# Patient Record
Sex: Male | Born: 1965 | Race: White | Hispanic: No | Smoking: Former smoker
Health system: Southern US, Community
[De-identification: ages and names within clinical notes are randomized; demographics above are authoritative.]

## PROBLEM LIST (undated history)

## (undated) DIAGNOSIS — J449 Chronic obstructive pulmonary disease, unspecified: Secondary | ICD-10-CM

## (undated) DIAGNOSIS — E785 Hyperlipidemia, unspecified: Secondary | ICD-10-CM

## (undated) DIAGNOSIS — T7840XA Allergy, unspecified, initial encounter: Secondary | ICD-10-CM

## (undated) DIAGNOSIS — I10 Essential (primary) hypertension: Secondary | ICD-10-CM

## (undated) HISTORY — PX: SHOULDER SURGERY: SHX246

## (undated) HISTORY — PX: KIDNEY SURGERY: SHX687

## (undated) HISTORY — DX: Hyperlipidemia, unspecified: E78.5

## (undated) HISTORY — DX: Allergy, unspecified, initial encounter: T78.40XA

## (undated) HISTORY — PX: KNEE SURGERY: SHX244

---

## 2005-03-03 DIAGNOSIS — M545 Low back pain: Secondary | ICD-10-CM

## 2005-03-19 ENCOUNTER — Emergency Department (HOSPITAL_COMMUNITY): Admission: EM | Admit: 2005-03-19 | Discharge: 2005-03-19 | Payer: Self-pay | Admitting: Family Medicine

## 2005-03-20 ENCOUNTER — Ambulatory Visit: Payer: Self-pay | Admitting: *Deleted

## 2005-03-20 ENCOUNTER — Ambulatory Visit: Payer: Self-pay | Admitting: Family Medicine

## 2005-03-28 ENCOUNTER — Ambulatory Visit: Payer: Self-pay | Admitting: Family Medicine

## 2005-03-30 ENCOUNTER — Ambulatory Visit: Payer: Self-pay | Admitting: Family Medicine

## 2005-03-31 ENCOUNTER — Emergency Department (HOSPITAL_COMMUNITY): Admission: EM | Admit: 2005-03-31 | Discharge: 2005-03-31 | Payer: Self-pay | Admitting: Emergency Medicine

## 2005-03-31 ENCOUNTER — Ambulatory Visit (HOSPITAL_COMMUNITY): Admission: RE | Admit: 2005-03-31 | Discharge: 2005-03-31 | Payer: Self-pay | Admitting: Family Medicine

## 2005-04-01 ENCOUNTER — Emergency Department (HOSPITAL_COMMUNITY): Admission: EM | Admit: 2005-04-01 | Discharge: 2005-04-01 | Payer: Self-pay | Admitting: Emergency Medicine

## 2005-04-16 ENCOUNTER — Ambulatory Visit: Payer: Self-pay | Admitting: Family Medicine

## 2005-04-26 ENCOUNTER — Ambulatory Visit: Payer: Self-pay | Admitting: Family Medicine

## 2005-05-04 ENCOUNTER — Ambulatory Visit: Payer: Self-pay | Admitting: Family Medicine

## 2005-05-10 ENCOUNTER — Ambulatory Visit: Payer: Self-pay | Admitting: Family Medicine

## 2005-07-11 ENCOUNTER — Ambulatory Visit: Payer: Self-pay | Admitting: Internal Medicine

## 2005-07-18 ENCOUNTER — Ambulatory Visit: Payer: Self-pay | Admitting: Family Medicine

## 2005-07-20 ENCOUNTER — Ambulatory Visit: Payer: Self-pay | Admitting: Family Medicine

## 2005-12-03 DIAGNOSIS — S82899A Other fracture of unspecified lower leg, initial encounter for closed fracture: Secondary | ICD-10-CM | POA: Insufficient documentation

## 2006-04-11 ENCOUNTER — Ambulatory Visit: Payer: Self-pay | Admitting: Family Medicine

## 2006-07-08 ENCOUNTER — Emergency Department (HOSPITAL_COMMUNITY): Admission: EM | Admit: 2006-07-08 | Discharge: 2006-07-08 | Payer: Self-pay | Admitting: Emergency Medicine

## 2006-07-11 ENCOUNTER — Emergency Department (HOSPITAL_COMMUNITY): Admission: EM | Admit: 2006-07-11 | Discharge: 2006-07-11 | Payer: Self-pay | Admitting: Emergency Medicine

## 2006-09-24 ENCOUNTER — Inpatient Hospital Stay (HOSPITAL_COMMUNITY): Admission: EM | Admit: 2006-09-24 | Discharge: 2006-09-29 | Payer: Self-pay | Admitting: Emergency Medicine

## 2006-09-24 ENCOUNTER — Ambulatory Visit: Payer: Self-pay | Admitting: Internal Medicine

## 2006-10-01 ENCOUNTER — Ambulatory Visit: Payer: Self-pay | Admitting: Gastroenterology

## 2006-11-17 ENCOUNTER — Emergency Department (HOSPITAL_COMMUNITY): Admission: EM | Admit: 2006-11-17 | Discharge: 2006-11-17 | Payer: Self-pay | Admitting: Emergency Medicine

## 2007-05-05 ENCOUNTER — Emergency Department (HOSPITAL_COMMUNITY): Admission: EM | Admit: 2007-05-05 | Discharge: 2007-05-06 | Payer: Self-pay | Admitting: Emergency Medicine

## 2007-07-10 DIAGNOSIS — M069 Rheumatoid arthritis, unspecified: Secondary | ICD-10-CM | POA: Insufficient documentation

## 2007-07-11 DIAGNOSIS — K3189 Other diseases of stomach and duodenum: Secondary | ICD-10-CM | POA: Insufficient documentation

## 2007-07-11 DIAGNOSIS — F1910 Other psychoactive substance abuse, uncomplicated: Secondary | ICD-10-CM | POA: Insufficient documentation

## 2007-07-11 DIAGNOSIS — F17200 Nicotine dependence, unspecified, uncomplicated: Secondary | ICD-10-CM

## 2007-07-11 DIAGNOSIS — R1013 Epigastric pain: Secondary | ICD-10-CM

## 2007-10-31 ENCOUNTER — Emergency Department (HOSPITAL_COMMUNITY): Admission: EM | Admit: 2007-10-31 | Discharge: 2007-10-31 | Payer: Self-pay | Admitting: Internal Medicine

## 2007-11-06 ENCOUNTER — Emergency Department (HOSPITAL_COMMUNITY): Admission: EM | Admit: 2007-11-06 | Discharge: 2007-11-06 | Payer: Self-pay | Admitting: Emergency Medicine

## 2008-05-12 ENCOUNTER — Emergency Department (HOSPITAL_COMMUNITY): Admission: EM | Admit: 2008-05-12 | Discharge: 2008-05-12 | Payer: Self-pay | Admitting: Emergency Medicine

## 2008-10-22 ENCOUNTER — Ambulatory Visit: Payer: Self-pay | Admitting: Family Medicine

## 2008-10-23 ENCOUNTER — Encounter (INDEPENDENT_AMBULATORY_CARE_PROVIDER_SITE_OTHER): Payer: Self-pay | Admitting: Internal Medicine

## 2008-10-23 LAB — CONVERTED CEMR LAB
ALT: 14 units/L (ref 0–53)
AST: 15 units/L (ref 0–37)
Albumin: 4.6 g/dL (ref 3.5–5.2)
Alkaline Phosphatase: 119 units/L — ABNORMAL HIGH (ref 39–117)
BUN: 16 mg/dL (ref 6–23)
Basophils Absolute: 0.1 10*3/uL (ref 0.0–0.1)
Basophils Relative: 1 % (ref 0–1)
Eosinophils Absolute: 0.3 10*3/uL (ref 0.0–0.7)
HCV Ab: NEGATIVE
Hep A IgM: NEGATIVE
MCHC: 32.9 g/dL (ref 30.0–36.0)
MCV: 87.8 fL (ref 78.0–100.0)
Monocytes Relative: 10 % (ref 3–12)
Neutrophils Relative %: 63 % (ref 43–77)
Platelets: 276 10*3/uL (ref 150–400)
Potassium: 4.9 meq/L (ref 3.5–5.3)
RDW: 13.1 % (ref 11.5–15.5)

## 2009-05-08 ENCOUNTER — Inpatient Hospital Stay (HOSPITAL_COMMUNITY): Admission: EM | Admit: 2009-05-08 | Discharge: 2009-05-10 | Payer: Self-pay | Admitting: Emergency Medicine

## 2009-05-11 ENCOUNTER — Inpatient Hospital Stay (HOSPITAL_COMMUNITY): Admission: EM | Admit: 2009-05-11 | Discharge: 2009-05-14 | Payer: Self-pay | Admitting: Emergency Medicine

## 2009-07-07 ENCOUNTER — Ambulatory Visit: Payer: Self-pay | Admitting: Family Medicine

## 2009-07-15 ENCOUNTER — Emergency Department (HOSPITAL_COMMUNITY): Admission: EM | Admit: 2009-07-15 | Discharge: 2009-07-15 | Payer: Self-pay | Admitting: Emergency Medicine

## 2009-11-02 ENCOUNTER — Emergency Department (HOSPITAL_COMMUNITY): Admission: EM | Admit: 2009-11-02 | Discharge: 2009-11-02 | Payer: Self-pay | Admitting: Emergency Medicine

## 2010-08-03 ENCOUNTER — Emergency Department (HOSPITAL_COMMUNITY): Admission: EM | Admit: 2010-08-03 | Discharge: 2010-08-03 | Payer: Self-pay | Admitting: Emergency Medicine

## 2010-08-10 ENCOUNTER — Ambulatory Visit: Payer: Self-pay | Admitting: Family Medicine

## 2010-08-10 LAB — CONVERTED CEMR LAB
Albumin: 4.3 g/dL (ref 3.5–5.2)
Alkaline Phosphatase: 107 units/L (ref 39–117)
BUN: 18 mg/dL (ref 6–23)
Eosinophils Absolute: 0.2 10*3/uL (ref 0.0–0.7)
Eosinophils Relative: 3 % (ref 0–5)
Glucose, Bld: 82 mg/dL (ref 70–99)
HCT: 41.9 % (ref 39.0–52.0)
Lymphs Abs: 1.7 10*3/uL (ref 0.7–4.0)
MCV: 86.6 fL (ref 78.0–100.0)
Monocytes Absolute: 0.8 10*3/uL (ref 0.1–1.0)
Monocytes Relative: 11 % (ref 3–12)
RBC: 4.84 M/uL (ref 4.22–5.81)
Testosterone: 51.72 ng/dL — ABNORMAL LOW (ref 350–890)
Total Bilirubin: 0.5 mg/dL (ref 0.3–1.2)
Vit D, 25-Hydroxy: <10 ng/mL — ABNORMAL LOW (ref 30–89)
WBC: 7.1 10*3/uL (ref 4.0–10.5)

## 2010-09-25 ENCOUNTER — Inpatient Hospital Stay (HOSPITAL_COMMUNITY)
Admission: EM | Admit: 2010-09-25 | Discharge: 2010-09-29 | Payer: Self-pay | Source: Home / Self Care | Admitting: Emergency Medicine

## 2010-10-30 ENCOUNTER — Emergency Department (HOSPITAL_COMMUNITY)
Admission: EM | Admit: 2010-10-30 | Discharge: 2010-10-30 | Payer: Self-pay | Source: Home / Self Care | Admitting: Emergency Medicine

## 2011-02-14 LAB — DIFFERENTIAL
Basophils Absolute: 0 10*3/uL (ref 0.0–0.1)
Basophils Absolute: 0 10*3/uL (ref 0.0–0.1)
Basophils Relative: 0 % (ref 0–1)
Basophils Relative: 0 % (ref 0–1)
Eosinophils Absolute: 0 10*3/uL (ref 0.0–0.7)
Eosinophils Absolute: 0.1 10*3/uL (ref 0.0–0.7)
Eosinophils Relative: 0 % (ref 0–5)
Eosinophils Relative: 0 % (ref 0–5)
Lymphs Abs: 1.4 10*3/uL (ref 0.7–4.0)
Monocytes Absolute: 1.3 10*3/uL — ABNORMAL HIGH (ref 0.1–1.0)
Monocytes Absolute: 1.4 10*3/uL — ABNORMAL HIGH (ref 0.1–1.0)
Monocytes Absolute: 1.5 10*3/uL — ABNORMAL HIGH (ref 0.1–1.0)
Monocytes Relative: 7 % (ref 3–12)
Monocytes Relative: 8 % (ref 3–12)
Monocytes Relative: 9 % (ref 3–12)
Neutro Abs: 12.4 10*3/uL — ABNORMAL HIGH (ref 1.7–7.7)
Neutrophils Relative %: 82 % — ABNORMAL HIGH (ref 43–77)

## 2011-02-14 LAB — CBC
HCT: 35.3 % — ABNORMAL LOW (ref 39.0–52.0)
HCT: 36.7 % — ABNORMAL LOW (ref 39.0–52.0)
Hemoglobin: 11 g/dL — ABNORMAL LOW (ref 13.0–17.0)
Hemoglobin: 11.2 g/dL — ABNORMAL LOW (ref 13.0–17.0)
Hemoglobin: 11.6 g/dL — ABNORMAL LOW (ref 13.0–17.0)
Hemoglobin: 12.1 g/dL — ABNORMAL LOW (ref 13.0–17.0)
MCH: 26.6 pg (ref 26.0–34.0)
MCH: 26.8 pg (ref 26.0–34.0)
MCHC: 30.6 g/dL (ref 30.0–36.0)
MCHC: 30.9 g/dL (ref 30.0–36.0)
MCHC: 31.2 g/dL (ref 30.0–36.0)
MCHC: 31.6 g/dL (ref 30.0–36.0)
MCV: 85.3 fL (ref 78.0–100.0)
RBC: 4.3 MIL/uL (ref 4.22–5.81)
RBC: 4.65 MIL/uL (ref 4.22–5.81)
RDW: 14.1 % (ref 11.5–15.5)
RDW: 14.1 % (ref 11.5–15.5)
WBC: 19 10*3/uL — ABNORMAL HIGH (ref 4.0–10.5)

## 2011-02-14 LAB — FERRITIN: Ferritin: 228 ng/mL (ref 22–322)

## 2011-02-14 LAB — IRON AND TIBC
Saturation Ratios: 4 % — ABNORMAL LOW (ref 20–55)
UIBC: 294 ug/dL

## 2011-02-14 LAB — CULTURE, RESPIRATORY W GRAM STAIN

## 2011-02-14 LAB — COMPREHENSIVE METABOLIC PANEL
ALT: 30 U/L (ref 0–53)
AST: 26 U/L (ref 0–37)
Albumin: 3.8 g/dL (ref 3.5–5.2)
CO2: 33 mEq/L — ABNORMAL HIGH (ref 19–32)
Calcium: 8.7 mg/dL (ref 8.4–10.5)
GFR calc Af Amer: >60 mL/min (ref >60)
GFR calc non Af Amer: >60 mL/min (ref >60)
Sodium: 135 mEq/L (ref 135–145)

## 2011-02-14 LAB — RAPID URINE DRUG SCREEN, HOSP PERFORMED
Amphetamines: NOT DETECTED
Benzodiazepines: NOT DETECTED
Opiates: POSITIVE — AB
Tetrahydrocannabinol: NOT DETECTED

## 2011-02-14 LAB — EXPECTORATED SPUTUM ASSESSMENT W GRAM STAIN, RFLX TO RESP C

## 2011-02-14 LAB — URINALYSIS, ROUTINE W REFLEX MICROSCOPIC
Bilirubin Urine: NEGATIVE
Specific Gravity, Urine: 1.009 (ref 1.005–1.030)
pH: 6 (ref 5.0–8.0)

## 2011-02-14 LAB — BASIC METABOLIC PANEL
BUN: 18 mg/dL (ref 6–23)
CO2: 34 mEq/L — ABNORMAL HIGH (ref 19–32)
GFR calc non Af Amer: >60 mL/min (ref >60)
Glucose, Bld: 143 mg/dL — ABNORMAL HIGH (ref 70–99)
Potassium: 4.4 mEq/L (ref 3.5–5.1)

## 2011-02-14 LAB — VITAMIN B12: Vitamin B-12: 295 pg/mL (ref 211–911)

## 2011-02-14 LAB — CARDIAC PANEL(CRET KIN+CKTOT+MB+TROPI)
CK, MB: 11.2 ng/mL (ref 0.3–4.0)
Relative Index: 6.7 — ABNORMAL HIGH (ref 0.0–2.5)

## 2011-02-14 LAB — FOLATE: Folate: 7.4 ng/mL

## 2011-02-14 LAB — URINE MICROSCOPIC-ADD ON

## 2011-02-14 LAB — URINE CULTURE
Colony Count: 2000
Culture  Setup Time: 201110241830

## 2011-02-14 LAB — RETICULOCYTES: Retic Ct Pct: 1.3 % (ref 0.4–3.1)

## 2011-03-12 LAB — POCT I-STAT 3, ART BLOOD GAS (G3+)
Acid-Base Excess: 3 mmol/L — ABNORMAL HIGH (ref 0.0–2.0)
Bicarbonate: 33.6 mEq/L — ABNORMAL HIGH (ref 20.0–24.0)
Bicarbonate: 34.3 mEq/L — ABNORMAL HIGH (ref 20.0–24.0)
O2 Saturation: 87 %
O2 Saturation: 98 %
Patient temperature: 98.9
TCO2: 36 mmol/L (ref 0–100)
TCO2: 36 mmol/L (ref 0–100)
pCO2 arterial: 61.9 mmHg (ref 35.0–45.0)
pCO2 arterial: 74.7 mmHg (ref 35.0–45.0)
pH, Arterial: 7.261 — ABNORMAL LOW (ref 7.350–7.450)
pH, Arterial: 7.347 — ABNORMAL LOW (ref 7.350–7.450)
pH, Arterial: 7.361 (ref 7.350–7.450)
pO2, Arterial: 37 mmHg — CL (ref 80.0–100.0)
pO2, Arterial: 63 mmHg — ABNORMAL LOW (ref 80.0–100.0)

## 2011-03-12 LAB — BASIC METABOLIC PANEL
BUN: 13 mg/dL (ref 6–23)
CO2: 34 mEq/L — ABNORMAL HIGH (ref 19–32)
Calcium: 8.3 mg/dL — ABNORMAL LOW (ref 8.4–10.5)
Calcium: 8.5 mg/dL (ref 8.4–10.5)
Calcium: 8.5 mg/dL (ref 8.4–10.5)
Calcium: 8.7 mg/dL (ref 8.4–10.5)
Creatinine, Ser: 0.8 mg/dL (ref 0.4–1.5)
Creatinine, Ser: 0.84 mg/dL (ref 0.4–1.5)
Creatinine, Ser: 0.98 mg/dL (ref 0.4–1.5)
GFR calc Af Amer: >60 mL/min (ref >60)
GFR calc Af Amer: >60 mL/min (ref >60)
GFR calc Af Amer: >60 mL/min (ref >60)
GFR calc non Af Amer: >60 mL/min (ref >60)
GFR calc non Af Amer: >60 mL/min (ref >60)
GFR calc non Af Amer: >60 mL/min (ref >60)
GFR calc non Af Amer: >60 mL/min (ref >60)
Glucose, Bld: 123 mg/dL — ABNORMAL HIGH (ref 70–99)
Glucose, Bld: 127 mg/dL — ABNORMAL HIGH (ref 70–99)
Glucose, Bld: 157 mg/dL — ABNORMAL HIGH (ref 70–99)
Potassium: 4 mEq/L (ref 3.5–5.1)
Potassium: 4.3 mEq/L (ref 3.5–5.1)
Sodium: 135 mEq/L (ref 135–145)
Sodium: 135 mEq/L (ref 135–145)
Sodium: 138 mEq/L (ref 135–145)

## 2011-03-12 LAB — CBC
HCT: 51.8 % (ref 39.0–52.0)
Hemoglobin: 16.5 g/dL (ref 13.0–17.0)
MCHC: 31.8 g/dL (ref 30.0–36.0)
MCHC: 32.3 g/dL (ref 30.0–36.0)
MCV: 85 fL (ref 78.0–100.0)
Platelets: 142 10*3/uL — ABNORMAL LOW (ref 150–400)
Platelets: 200 10*3/uL (ref 150–400)
Platelets: 215 10*3/uL (ref 150–400)
RBC: 6.1 MIL/uL — ABNORMAL HIGH (ref 4.22–5.81)
RDW: 16.3 % — ABNORMAL HIGH (ref 11.5–15.5)
RDW: 16.8 % — ABNORMAL HIGH (ref 11.5–15.5)
RDW: 16.9 % — ABNORMAL HIGH (ref 11.5–15.5)
WBC: 10.7 10*3/uL — ABNORMAL HIGH (ref 4.0–10.5)
WBC: 11.6 10*3/uL — ABNORMAL HIGH (ref 4.0–10.5)

## 2011-03-12 LAB — DIFFERENTIAL
Basophils Absolute: 0 10*3/uL (ref 0.0–0.1)
Basophils Absolute: 0 10*3/uL (ref 0.0–0.1)
Basophils Relative: 0 % (ref 0–1)
Basophils Relative: 0 % (ref 0–1)
Lymphocytes Relative: 12 % (ref 12–46)
Lymphocytes Relative: 9 % — ABNORMAL LOW (ref 12–46)
Monocytes Absolute: 0.4 10*3/uL (ref 0.1–1.0)
Monocytes Relative: 4 % (ref 3–12)
Neutro Abs: 10.1 10*3/uL — ABNORMAL HIGH (ref 1.7–7.7)
Neutro Abs: 4.1 10*3/uL (ref 1.7–7.7)
Neutrophils Relative %: 77 % (ref 43–77)
Neutrophils Relative %: 88 % — ABNORMAL HIGH (ref 43–77)

## 2011-03-12 LAB — CULTURE, RESPIRATORY W GRAM STAIN

## 2011-03-12 LAB — CK TOTAL AND CKMB (NOT AT ARMC)
CK, MB: 5.3 ng/mL — ABNORMAL HIGH (ref 0.3–4.0)
CK, MB: 6.8 ng/mL — ABNORMAL HIGH (ref 0.3–4.0)
Relative Index: 5.9 — ABNORMAL HIGH (ref 0.0–2.5)
Relative Index: INVALID (ref 0.0–2.5)
Total CK: 87 U/L (ref 7–232)

## 2011-03-12 LAB — EXPECTORATED SPUTUM ASSESSMENT W GRAM STAIN, RFLX TO RESP C

## 2011-03-12 LAB — BLOOD GAS, ARTERIAL
Acid-Base Excess: 7.8 mmol/L — ABNORMAL HIGH (ref 0.0–2.0)
Bicarbonate: 33.9 mEq/L — ABNORMAL HIGH (ref 20.0–24.0)
O2 Content: 2 L/min
O2 Saturation: 92.1 %
Patient temperature: 98.1
TCO2: 36.1 mmol/L (ref 0–100)
pCO2 arterial: 69 mmHg (ref 35.0–45.0)
pH, Arterial: 7.311 — ABNORMAL LOW (ref 7.350–7.450)
pO2, Arterial: 66.3 mmHg — ABNORMAL LOW (ref 80.0–100.0)

## 2011-03-12 LAB — D-DIMER, QUANTITATIVE
D-Dimer, Quant: 0.28 ug/mL-FEU (ref 0.00–0.48)
D-Dimer, Quant: 0.34 ug/mL-FEU (ref 0.00–0.48)

## 2011-03-12 LAB — LIPID PANEL
Cholesterol: 96 mg/dL (ref 0–200)
HDL: 36 mg/dL — ABNORMAL LOW (ref >39)
Total CHOL/HDL Ratio: 2.7 RATIO
Triglycerides: 60 mg/dL (ref <150)

## 2011-03-12 LAB — BRAIN NATRIURETIC PEPTIDE: Pro B Natriuretic peptide (BNP): 121 pg/mL — ABNORMAL HIGH (ref 0.0–100.0)

## 2011-03-12 LAB — TROPONIN I: Troponin I: 0.01 ng/mL (ref 0.00–0.06)

## 2011-04-17 NOTE — Discharge Summary (Signed)
NAME:  Brent Oneal, Brent Oneal                 ACCOUNT NO.:  [id number]   MEDICAL RECORD NO.:  [id number]          PATIENT TYPE:  INP   LOCATION:  3005                         FACILITY:  [facility]   PHYSICIAN:  Ruthy Dick, MD    DATE OF BIRTH:  1966-03-19   DATE OF ADMISSION:  05/07/2009  DATE OF DISCHARGE:  05/10/2009                               DISCHARGE SUMMARY   REASON FOR ADMISSION:  COPD exacerbation, hypoxia, and bronchitis.   FINAL DISCHARGE DIAGNOSES:  1. Respiratory failure, resolved.  2. Hypoxia and hypoxemia.  3. Chronic obstructive pulmonary disease exacerbation.  4. Acute bronchitis.  5. Chronic pain syndrome.  6. Narcotic dependence.  7. Tobacco abuse.  8. Hypertension.  9. Methadone dependence.   PROCEDURES DONE DURING THIS ADMISSION:  None.   BRIEF HISTORY OF PRESENTING ILLNESS AND HOSPITAL COURSE:  This is a 89-  year Caucasian gentleman with a history of COPD and tobacco abuse and  also chronic pain syndrome on methadone who came to the hospital because  2 days prior to presentation, he has been having persistent cough with  episodic fever.  He said his cough was mostly nonproductive.  In the  emergency, he was noted to be wheezing and hypoxic and admitted to the  hospital for this reason.  Treated with steroid nebulizations and  antibiotics with good resolution of his symptoms.  He was, however,  found on discharge evaluations that he indeed probably will need oxygen  at home as he remained hypoxic off oxygen.  D-dimer was done, which  thought to be negative for making that they need to do CT angiogram  redundant.  The patient has done well and has no symptoms today.  He  says he is feeling much better today.  No chest pain.  No shortness of  breath.  No abdominal pain.  No nausea.  No vomiting.   PHYSICAL EXAMINATION:  VITAL SIGNS:  Temperature 97.6, pulse 99,  respiration 18, blood pressure 156/97, saturating 93% on 2-3 liters.  HEENT:  Normocephalic.  CHEST:   Clear to auscultation bilaterally today, no wheezing.  ABDOMEN:  Soft, nontender.  EXTREMITIES:  No clubbing, no cyanosis, no edema.  CARDIOVASCULAR:  First and second heart sounds only.  CENTRAL NERVOUS SYSTEM:  Nonfocal.   The patient is to follow up with his primary care physician Dr.  Audria Nine at [facility] in about 1 week.  He is also to have pulmonary  function test done in the outpatient setting in a week.   DISCHARGE MEDICATIONS:  1. Flovent MDI one puff q.6 h. p.r.n.  2. Albuterol MDI q.6 h. p.r.n. began for wheezing.  3. Methadone one 10 mg p.o. daily.  4. Avelox 400 mg p.o. daily for five days.  5. Medrol Dosepak #1 per instructions.  6. Advair 250/50 one puff b.i.d.  7. Procardia XL 60 mg p.o. daily.  8. Oxygen at 2-3 liters per minute, continuous.  9. Mucinex 1200 mg p.o. b.i.d. x5 days.   Time used for discharge planning, greater than 30 minutes.  The patient  has been advised extensively that he needs  to quit tobacco abuse and he  promises to try.  He gets his pain medication methadone from our  [facility].      Ruthy Dick, MD  Electronically Signed     GU/MEDQ  D:  05/10/2009  T:  05/11/2009  Job:  [id number]   cc:   Maurice March, M.D.

## 2011-04-17 NOTE — H&P (Signed)
NAME:  Brent Oneal, Brent Oneal                 ACCOUNT NO.:  [id number]   MEDICAL RECORD NO.:  [id number]          PATIENT TYPE:  EMS   LOCATION:  MAJO                         FACILITY:  [facility]   PHYSICIAN:  Lonia Blood, M.D.DATE OF BIRTH:  23-Jul-1966   DATE OF ADMISSION:  05/11/2009  DATE OF DISCHARGE:                              HISTORY & PHYSICAL   NOTE:  This is a brief readmit note.  He was discharged from the  hospital service just yesterday.   For full details concerning his history, his past medical history, his  family history, and social history, please see dictated history and  physical as of May 08, 2009.   CHIEF COMPLAINT:  Recurrence of wheezing with severe shortness of  breath.   HISTORY OF PRESENT ILLNESS:  Mr. Brent Oneal is a 45 year old gentleman  who is well-known to our service.  He has severe COPD with frequent  episodes of bronchospasm and acute respiratory distress.  He was  admitted to the hospital May 08, 2009, discharged May 10, 2009, with  diagnosis of acute bronchitis with acute bronchospastic COPD  exacerbation.  He was treated with the usual inhaled medications plus  steroids at that time.  He was discharged home on a Medrol Dosepak,  inhalers, and actually antibiotics on May 10, 2009.  The patient states  that he was still wheezing somewhat at discharge but felt a little  better.  He awoke this morning, however, to find himself in extremis.  He states that he did not know where he was and was gasping for air so  severely that his roommate became quite concerned.  The patient states,  however, that he then went back to sleep.  When he woke up again, he  found that the only way he could catch his breath was to sit on the side  of the bed and lean over, and that he felt that his wheezing was getting  worse and his shortness of breath was progressing.  He decided to  present to the Emergency Room.  In the Emergency Room, the patient has  been treated with  magnesium sulfate, a high-dose continuous neb, and has  even been placed on BiPAP short-term.  Nonetheless, he continues to  wheeze and is not felt to be stable for discharge home.   REVIEW OF SYSTEMS:  Comprehensive review of systems is unremarkable and  the patient has no complaints other than shortness of breath that he  reports in the history of present illness above.   PAST MEDICAL HISTORY:  Reviewed as per recent history and physical.   OUTPATIENT MEDICATIONS:  As per discharge summary May 10, 2009.   ALLERGIES:  Unchanged from previous admission.   FAMILY AND SOCIAL HISTORY:  Please see history and physical of May 08, 2009.   DATA REVIEW:  Sodium, potassium, chloride, and bicarb are normal.  His  BUN is elevated at 25.  Creatinine is normal.  Serum glucose and calcium  are normal.  White count is elevated at 11.6.  Hemoglobin and platelet  count are normal.  MCV is  normal.  ABG reveals a pH of 7.26, a PCO2 of  75, and a PO2 of 63.  Chest x-ray reveals no acute disease.   PHYSICAL EXAMINATION:  Temperature 98.9, blood pressure 181/84, heart  rate 84, respiratory rate 24, O2 sat 93% on 8 L/min nasal cannula.  GENERAL:  Disheveled gentleman in moderate to severe respiratory  distress with audible wheeze upon entering room.  LUNGS:  Diffuse expiratory wheeze that persists throughout the entire  expiratory phase right up until the point of inspiration.  The patient  desaturates significantly (i.e., into the low 80s) with attempts at  conversation.  There is no cyanosis appreciable.  CARDIOVASCULAR:  Tachycardic but regular without gallop or rub with no  appreciable murmur.  ABDOMEN:  Nontender, nondistended, soft, bowel sounds present, no  organomegaly, no rebound, no ascites.  EXTREMITIES:  No significant cyanosis, clubbing, or edema of bilateral  lower extremities.  NEUROLOGIC:  The patient is alert and oriented.  He is not somnolent.  He is not lethargic.   IMPRESSION  AND PLAN:  Mr. Brent Oneal appears to be suffering with a  recurrence of his severe chronic obstructive pulmonary disease.  This is  marked by severe bronchospasm.  I will continue the medical treatment as  initiated previously.  I will place the patient on stepdown due to the  fact that he will be at high risk for further decompensation and may, in  fact, even require intubation.  Perhaps more likely is the fact that he  may require BiPAP throughout the night.  For now, he is off BiPAP and  using a Ventimask.  We will continue this oxygenation modality for now  and follow his physical exam.  He will be dosed with high-dose steroids  again and we will continue his nebulizer therapies as previously  administered.  Otherwise, I will continue all of the patient's usual  home medications and we will follow him closely, and he will be  monitored very closely.      Lonia Blood, M.D.  Electronically Signed     JTM/MEDQ  D:  05/11/2009  T:  05/11/2009  Job:  [id number]

## 2011-04-17 NOTE — H&P (Signed)
NAME:  Brent Oneal, Brent Oneal                 ACCOUNT NO.:  [id number]   MEDICAL RECORD NO.:  [id number]          PATIENT TYPE:  INP   LOCATION:  3112                         FACILITY:  [facility]   PHYSICIAN:  Vania Rea, M.D. DATE OF BIRTH:  1966-03-19   DATE OF ADMISSION:  05/07/2009  DATE OF DISCHARGE:                              HISTORY & PHYSICAL   PRIMARY CARE PHYSICIAN:  [facility].   CHIEF COMPLAINT:  Shortness of breath and cough of a few days.   HISTORY OF PRESENT ILLNESS:  This is a 45 year old Caucasian gentleman  with a history of COPD, chronic tobacco use, chronic pain syndrome  methadone dependent, who states that for the past 2-3 days he has been  having persistent cough with episodic fevers.  Cough was initially  nonproductive but today he started producing white sputum. The patient  came to the emergency room, was found to be saturating in the 70s, was  treated with a nonrebreather and serial nebulizations, remains hypoxic  and wheezing and the hospitalist service was called to assist in  management.   The patient says that because of surgery as a teenager on a malformed  left kidney, he was left with chronic back pain and became addicted to  pain medications.  He is now on chronic methadone therapy.  The patient  says he has to attend the methadone clinic every day and gets 110 mg a  day.  The patient reports that he attends the [facility].   The patient denies chest pain, headache, nausea, vomiting, diarrhea or  constipation.   PAST MEDICAL HISTORY:  1. Chronic back pain, on chronic methadone therapy.  2. 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  3. History of right shoulder surgery.  4. History of right ankle fracture in 2006.  5. History of marijuana abuse.  6. Tobacco abuse.   MEDICATIONS:  1. Flovent 2 puffs when necessary.  2. Albuterol 2 puffs when necessary.  3. Methadone 110 mg a day.   ALLERGIES:  CELEBREX causes a rash and AMITRIPTYLENE causes psychotic  reaction.   SOCIAL HISTORY:  He is down to smoking 1/2 pack of tobacco per day.  Denies alcohol or illicit drug use.  Methadone as noted above.   FAMILY HISTORY:  Significant for mother who died of bone cancer and  father with coronary artery disease.   REVIEW OF SYSTEMS:  A 10-point review of systems, other than noted  above, was negative.   PHYSICAL EXAMINATION:  Well-built, Caucasian gentleman, reclining on the  stretcher, wearing 100% nonrebreather mask.  Able to talk to give his  history.  Does not appear overtly short of breath but is saturating at  98% on 100% nonrebreather.  Temperature 100.8, pulse is 100, respirations 20 and blood pressure  166/100.  He describes his pain level as a 9/10.  HEENT:  Pupils are round and equal.  Mucous membranes pink and  anicteric.  No cervical lymphadenopathy or thyromegaly.  No carotid  bruits.  CHEST:  He has a barrel chest with  diffuse generalized rhonchi.  CARDIOVASCULAR:  Tachycardic, no murmurs.  ABDOMEN:  Obese, soft, no masses are felt.  EXTREMITIES:  He has multiple petechial like blotches on his legs which  he says are old but there is no frank ulceration. He has 2+ dorsalis  pedis pulses bilaterally.  CENTRAL NERVOUS SYSTEM:  Cranial nerves II through XII are grossly  intact and he has no focal neurologic deficit.   LABORATORY DATA:  White count is 5.4, hemoglobin 16.5, hematocrit 50.9,  MCV 86.3, platelet count 142,000.  He has 77% neutrophils, differential  is normal.  Sodium 135, potassium 4.8, chloride 93, CO2 34, glucose 123,  BUN 9, creatinine 0.8, calcium 8.7.  His D-dimer is normal at 0.34.  A 2-  view chest x-ray shows moderate changes of acute asthma or acute  bronchitis without localized air space pneumonia.   ASSESSMENT:  1. Acute respiratory failure.  2. Acute exacerbation of asthma/chronic obstructive pulmonary disease.  3. History of  chronic pain on chronic methadone.  4. Ongoing tobacco abuse.   PLAN:  1. Will get an ABG on the nonrebreather to assess how to titrate his      oxygen and manage him.  Will treat him with IV Avelox, steroids and      serial nebulizations.  Will review his progress after 12 hours to      see if we need input from the pulmonary service.  2. Will continue daily methadone as he describes .  3. Other plans as per orders.      Vania Rea, M.D.  Electronically Signed     LC/MEDQ  D:  05/08/2009  T:  05/08/2009  Job:  [id number]   cc:   Dala Dock

## 2011-04-17 NOTE — Discharge Summary (Signed)
NAME:  Oneal, Brent                 ACCOUNT NO.:  [id number]   MEDICAL RECORD NO.:  [id number]          PATIENT TYPE:  INP   LOCATION:  3743                         FACILITY:  [facility]   PHYSICIAN:  Hind I Elsaid, MD      DATE OF BIRTH:  Oct 16, 1966   DATE OF ADMISSION:  05/11/2009  DATE OF DISCHARGE:  05/14/2009                               DISCHARGE SUMMARY   DISCHARGE DIAGNOSES:  1. Hypercapnic and hypoxemic, and hypoxic respiratory failure.  2. Chronic obstructive pulmonary disease with exacerbation.  3. Staphylococcus aureus bronchitis.  4. Oral thrush.  5. Chronic pain syndrome and narcotic dependence.  6. Tobacco abuse.  7. Hypertension.  8. Methadone dependence.   MEDICATIONS:  1. Prednisone taper dose.  2. Methadone 110 mg p.o. daily.  3. Diflucan 100 mg p.o. daily.  4. Levaquin 500 mg p.o. daily for 7 days.  5. Albuterol MDI q.6 h. p.r.n.  6. Flovent MDI one puff q.6 h. p.r.n.  7. Advair 250/50 one puff twice daily.  8. Procardia XL 60 mg p.o. daily.  9. Oxygen 2 to 3 liters per minute continuously.  10.Mucinex 1200 mg p.o. b.i.d. for 5 days.  11.Procardia 90 mg p.o. daily.   PROCEDURES:  Chest x-ray did not show an acute event.   HISTORY OF PRESENT ILLNESS:  This is a 45 year old gentleman with COPD  with frequent episode of bronchospasm and acute respiratory failure.  Recently discharged from the hospital also diagnosed with hypoxemic and  hypercapnic respiratory failure.  The patient was admitted with  shortness of breath.  The patient was admitted in the emergency room,  has been treated with magnesium sulfate and high dose of continuous nebs  and being placed on BiPAP, shortly the patient was seen by our service.  He was transferred to step down for close monitoring.  Did not require  BiPAP during that night and the patient was also started on high dose  Solu-Medrol in addition to IV antibiotic and nebulizer treatment.  The  patient noticed to have culture of  the respiratory on May 08, 2009,  which grow Staphylococcus aureus and Candida albicans.  The patient also  noticed to have oral thrush on his mouth after removal of his denture.  Accordingly, the patient was placed on Levaquin 500 mg p.o. daily.  According to sensitivity in addition to Diflucan 100 mg p.o. daily.  The  patient was started on tapered dose of steroid and the patient's  breathing improved and it came back to baseline.  At this time, I felt  the patient is medically stable to be discharged home.  Also the patient  has been extensively advised.  I recommend to quit tobacco abuse and he  received smoking counseling during hospital stay.  Also, I felt the  patient is medically stable to be discharged home.  Timing spent 45  minutes and two prescriptions for 2 pills of methadone 110 mg was  prescribed for the patient as the patient admitted he did not have any  methadone.  I recommend to contact the methadone program on Monday.  Hind Bosie Helper, MD  Electronically Signed     Hind Bosie Helper, MD  Electronically Signed    HIE/MEDQ  D:  05/14/2009  T:  05/15/2009  Job:  [id number]

## 2011-04-20 NOTE — H&P (Signed)
NAME:  Brent Oneal, Brent Oneal                 ACCOUNT NO.:  [id number]   MEDICAL RECORD NO.:  [id number]          PATIENT TYPE:  INP   LOCATION:  6737                         FACILITY:  [facility]   PHYSICIAN:  Isidor Holts, M.D.  DATE OF BIRTH:  1966-05-04   DATE OF ADMISSION:  09/24/2006  DATE OF DISCHARGE:                                HISTORY & PHYSICAL   PRIMARY CARE PHYSICIAN:  Unassigned.   CHIEF COMPLAINT:  Sore throat, accompanied by cough, shortness of breath,  fever and chills approximately 2 days.  Also vomiting for 3 days.  Abdominal  pain for 3 days and chronic back pain.   HISTORY OF PRESENT ILLNESS:  This is a 45 year old male, with known history  of chronic low back pain and recurrent left pyelonephritis, who presents  with a 1 to 2 day history of sore throat, accompanied by a dry cough,  shortness of breath, fever and chills.  He states that he has been vomiting  for the past 3 days and vomited, at least, 3 times this a.m.  Also complains  of left lower quadrant pain for approximately 2 days, on and off.  Denies  urinary frequency or dysuria.  Denies diarrhea.  He states that he has been  troubled by back pain all his life and is currently on methadone treatment.  He eventually came to the emergency department because of persistence of  symptoms.   PAST MEDICAL HISTORY:  1. Recurrent left pyelonephritis.  2. Chronic back pain, on methadone treatment.  3. History of atrophic left kidney, status post surgery, status post      stent.  4. Status post right shoulder surgery.  5. Status post right ankle fracture, 2006.  6. History of marijuana use.  7. Smoking history.   MEDICATIONS HISTORY:  1. Trazodone 100 mg p.o. daily.  2. Methadone 70 mg p.o. daily.  Patient states that he attends [facility]      [facility] at [address] in [place].   ALLERGIES:  CELEBREX, THIS CAUSES A RASH.   REVIEW OF SYSTEMS:  Per HPI and chief complaint; otherwise,  negative.   SOCIAL HISTORY:  Patient is separated, unemployed, has 3 offspring.  Smokes  about half a pack to one pack of cigarettes per day.  Denies alcohol use.  Smokes marijuana.  Denies any other drug abuse.  Patient's mother died at  age 87 years from lung cancer.  His father died at age 59 years following an  MI.   FAMILY HISTORY:  Otherwise, noncontributory.   PHYSICAL EXAMINATION:  VITAL SIGNS:  Temperature 97.6.  Pulse 105 per  minute, regular.  Respiratory rate 18.  BP 143/92 mmHg.  Pulse oximetry 88%  on room air.  GENERAL:  Patient does not appear to be in obvious acute distress.  Alert  and oriented.  Not short of breath at rest, coughing intermittently, this is  productive of clear phlegm.  HEENT:  No clinical pallor.  No jaundice.  No conjunctival injection.  NECK:  Supple.  JVP not seen.  No palpable  lymphadenopathy.  No palpable  goiter.  No carotid bruits.  CHEST:  Clear to auscultation.  No wheezes or crackles.  HEART:  Sounds 1 and 2 heard.  Normal and regular.  No murmurs.  ABDOMEN:  Full, soft, mildly tender in suprapubic region/left lower  quadrant.  No guarding.  No rebound.  Normal bowel sounds.  LOWER EXTREMITY EXAMINATION:  No pitting edema.  Palpable peripheral pulses.  Patient has mild left costovertebral punch tenderness.  MUSCULOSKELETAL SYSTEM:  Patient has lumbosacral spinal tenderness to  palpation.  No paraspinal muscle spasm.  Appears to have weakly positive  straight leg raising signs, bilaterally.  CENTRAL NERVOUS SYSTEM:  No focal neurologic deficit on gross examination.   INVESTIGATION:  CBC:  WBC 8.7, hemoglobin 16.0, hematocrit 47.0, platelets  252.  Electrolytes:  Sodium 137, potassium 4.0, chloride 102, CO2 31.2, BUN  11, creatinine 1.1, glucose 88.  Urinalysis shows WBC 3 to 6, RBC 0 to 2,  bacteria few.  Chest x-ray dated September 24, 2006 shows hyperinflation;  otherwise, no abnormalities.   ASSESSMENT/PLAN:  1. Acute bronchitis.   The patient presents with upper respiratory tract      symptoms, associated with coughing, shortness of breath and sore      throat.  Rapid strep test is reportedly negative.  He has a well known      history of smoking.  I suspect he has acute bronchitis.  We will      therefore commence him on Avelox, bronchodilator nebulizers and oxygen.   1. Chronic back pain.  Patient states that he has been troubled by this      all of his life, and is currently on Methadone treatment for chronic      pain.  He does have positive physical findings, i.e. lumbosacral spinal      tenderness, as well as weakly positive straight leg raising      bilaterally.  Will commence analgesics and arrange MRI of lumbar sacral      spine to elucidate anatomy.   1. History of Methadone dependence.  Have made diligent attempts to call      the Meth[facility]one Clinic, which reportedly is at [address]      [place].  I even went through the [facility], all to no avail.  I have been unable to contact this clinic and      patient's does not seem to know their phone number.  So, for now, we      will likely place patient on 45 mg of methadone when he is able to take      things p.o. until the matter can be clarified.   1. Hypoxemia.  This is likely secondary to chronic obstructive pulmonary      disease, with acute bronchitis.  Chest x-ray is negative for      infiltrates or other pathology.   1. History of left kidney stent.  Urinalysis is negative.  However, we      shall do a renal ultrasound scan to rule out possible hydronephrosis.   1. Smoking history.  We shall counsel adequately and utilize Nicotine      patch.   1. Vomiting/abdominal pain.  Query acute gastritis.  We shall commence IV      fluids, antiemetics, bowel rest, proton pump inhibitor, but for      completeness,  i.e. view of abdominal pain we shall do serum lipase     levels and abdominal  CT scan.   1.  History of substance abuse.  We will do urine drug screen.  Patient to      be counseled accordingly.   Further management will depend on clinical course.      Isidor Holts, M.D.  Electronically Signed     CO/MEDQ  D:  09/24/2006  T:  09/25/2006  Job:  [id number]

## 2011-04-20 NOTE — Discharge Summary (Signed)
NAME:  Brent Oneal, Brent Oneal                 ACCOUNT NO.:  [id number]   MEDICAL RECORD NO.:  [id number]          PATIENT TYPE:  INP   LOCATION:  1191                         FACILITY:  [facility]   PHYSICIAN:  Michaelyn Barter, M.D. DATE OF BIRTH:  [birth year]   DATE OF ADMISSION:  09/24/2006  DATE OF DISCHARGE:  09/29/2006                                 DISCHARGE SUMMARY   FINAL DIAGNOSES:  1. Bronchial pneumonia.  2. Hypoxia.  3. Probable chronic obstructive pulmonary disease exacerbation.  4. Probable asthma exacerbation.  5. Biliary dilatation on CT scan.  6. Abdominal pain accompanied by nausea and vomiting.  7. Protein-calorie malnutrition.   SECONDARY DIAGNOSIS:  Methadone addiction.   PROCEDURES.:  1. Chest x-ray completed on October23.  2. Renal ultrasound completed October23,2007.  3. MRI of the lumbar spine completed October23,2007.  4. CT scan of the pelvis and abdomen with contrast completed on      October24,2007.  5. CT scan of the chest with angiographic contrast completed      October25,2007.  6. MRCP completed October25,2007.  7. Portable chest x-ray completed October26,2007.  8. Portable chest x-ray completed October27,2007.   CONSULTATIONS:  1. Gastroenterology with Rachael Fee, MD.  2. His pulmonary with Clinton D. Young, MD.   HISTORY OF PRESENT ILLNESS:  Brent Oneal is a 45 year old gentleman who arrived  stating that he had experienced 1-2 days of sore throat accompanied by a dry  cough, shortness of breath, fevers and chills.  He also complained of  vomiting for at least 3 days prior to this admission and stated that he had  vomited at least 3 times the morning of his admission.  He complained of  some left lower quadrant pain that had occurred off and on over the 2 days  leading up to this admission.  He denied having any urinary frequency or  dysuria.  As far as his back pain, he stated that this has been an ongoing  issue all of his life.   For past  medical history, please see that dictated by Dr. Isidor Holts on  October23,2007.   HOSPITAL COURSE:  Problem 1.  BRONCHOPNEUMONIA:  The patient had a chest x-ray completed on  September 24, 2006, which revealed hyperinflation consistent with asthma.  No  infiltrates or failure were seen at that time.  Cardiac size was normal.  Mild peribronchial thickening was noted.  The patient was started on Avelox  400 mg IV daily as well as receiving nebulized breathing treatments and  oxygen.  He had an arterial blood gas completed which found a pH of 7.388, a  pCO2 of 51.9, a pO2 of 43.9, a bicarb of 30.5 and an O2 saturation of 78.8.  Because he complained of some chest discomfort, the decision was made to get  a CT scan completed of the patient's chest with angiographic contrast to  rule out a pulmonary embolism.  This was done on September 26, 2006.  It  revealed no evidence of pulmonary embolus but bibasilar multifocal  peribronchovascular nodularity and air space consolidation with associated  airway  thickening compatible with bronchopneumonia identified.  The  patient's antibiotics were subsequently switched to ceftriaxone 1 g q.24h.  and azithromycin 500 mg IV daily.  Because of the patient's hypoxia and a  cough that was productive of very purulent-appearing material, the patient  was initially placed into isolation.  A PPD was placed.  It was found to be  negative 2 days later.  Sputum was collected for AFB.  This found to be  negative.  Likewise Gram stain and culture were also completed.  These were  unrevealing.  It appeared that the patient had produced a bad sample with  only rare gram-positive cocci identified.  Legionella profile was also found  to be negative as well as fungus was negative.  PCP was also negative.  In  addition, the patient was also tested for HIV and his HIV status was found  to be nonreactive.  Therefore, his test was negative.  Over the course of  the patient's  hospitalization his cough declined.  Repeat chest x-rays were  completed on October26, which revealed some increase in patchy or nodular  bibasilar infiltrates, left greater than right, and on October27 the  chest x-ray revealed mild hyperinflation consistent with COPD.  Improving  basilar aeration was noted.   Problem 2.  PROBABLE ASTHMA EXACERBATION:  Again, as noted, during the  initial chest x-ray findings were consistent with asthma.  The patient did  receive a combination of nebulized breathing treatments and was started on  IV steroids.  Over the course of his hospitalization his breathing improved.  His wheezing declined significantly   Problem 3.  PROBABLE CHRONIC OBSTRUCTIVE PULMONARY DISEASE EXACERBATION:  The patient does give a history of smoking cigarettes for numerous years, at  times greater than one pack per day.  Chest x-ray completed on September 28, 2006 did confirm mild hyperinflation consistent with COPD.  Therefore, there  may have also been a component of COPD exacerbation as well as asthma  exacerbation.  Again, the patient was treated with combination of nebulized  breathing treatments and steroids, initially IV Solu-Medrol, which was  subsequently switched to prednisone.  The patient appeared to have done well  with this regimen during the course of his hospitalization.   Problem 4.  DILATED INTRAHEPATIC AND EXTRAHEPATIC BILIARY DUCTS:  When the  patient initially arrived to the hospital he did complain of some abdominal  discomfort, which prompted CT scan of THE abdomen to be completed.  The  patient had a bilirubin total completed on September 25, 2006, which was only  minimally elevated at 1.3.  on October25 the patient's alkaline  phosphatase was noted to have gone from 105 at the time of admission to 122.  CT scan of the patient's abdomen completed on October25 revealed mild intrahepatic biliary duct dilatation involving both the intrahepatic and   extrahepatic biliary ducts.  MRCP was recommended.  Gastroenterology was  consulted.  Dr. Christella Hartigan saw the patient.  MRCP was completed on September 26, 2006.  It revealed unremarkable MRCP, no evidence of biliary obstruction or  biliary or pancreatic pathology.  A small complex right renal cyst measuring  1.8 cm without evidence of worrisome OR aggressive features on recent  contrast-enhanced CT.  Follow-up of the lesion was recommended within  6months.  Gastroenterology signed off as a result.   Problem 5.  NAUSEA AND VOMITING:  This also resolved during the course of  the patient's hospitalization.  He received p.r.n. antiemetics for this.  Problem 6.  PROTEIN-CALORIE MALNUTRITION:  The patient's albumin level on  October25,2007, was noted to be 3.0.  This more than likely was the  result of the patient's decreased p.o. intake.  He was encouraged to consume  larger amounts of meals and eat on a regular basis.   Problem 7.  HYPOXIA:  As noted, the patient did have multiple ABGs completed  during the course of his hospitalization.  Following his admission into the  hospital, the patient's revealed a pH of 7.368, a pCO2 of 53.6, a pO2 of  49.5, bicarb of 27.4 with an  O2 saturation of 82.4.  It appeared that the  patient was retaining CO2.  Over the course of his hospitalization his ABG  did improve significantly.  However, the pO2 never well rose significantly.  As a result, pulmonary was consulted.  Dr. Jetty Duhamel saw the patient.  He recommended that the patient be treated for a bronchopneumonia and stated  that the patient needed to document smoking cessation counseling, a flu  shot, steroid inhaler, and PFTs when he is more stable.  With regard to his  hypoxia, the patient was placed on continuous pulse oximetry in his O2  saturations remained greater than 95% throughout the throughout the last  several days of hospitalization.   Problem 8.  METHADONE ADDICTION:  The patient  initially indicated that he  had not had his methadone when he came into the hospital and stated that he  felt so he was going to go through withdrawal.  Methadone was instituted,  and he did not complain of any withdrawal-like symptoms during the course of  his hospitalization.   Problem 9.  CHRONIC RIGHT-SIDED BACK PAIN:  The patient states that he has  had this for many years.  He received p.r.n. pain medication as a result and  appeared to do fine.   CONDITION AT THE TIME OF DISCHARGE:  Immediately after walking into the  patient's room, he stated he wants to go home today.   His vitals:  His temperature is 98.5, respirations 20, blood pressure  147/84, O2 saturation 95% on room air.  The patient's arterial blood gas  completed today:  His pH is 7.420, pCO2 is 46.2, which is a significant  improvement from that of the time of his initial admission, pO2 of 63.7, bicarb 29.4, O2 saturation 92.9.  the patient currently states that he feels  much better and he has no current complaints.   The patient will be discharged from the hospital today on:  1. Sterapred Double Strength.  He will take the tapering dose as directed.  2. Dilaudid 2 mg one tablet p.o. q.6h. p.r.n.  3. Combivent MDI two puffs q.i.d.  4. Avelox 400 mg one tablet p.o. q.d.  5. Protonix 40 mg daily.  6. Nicotine patch 14 mg daily.  7. Flonase, he will take as directed.      Michaelyn Barter, M.D.  Electronically Signed     OR/MEDQ  D:  09/29/2006  T:  09/30/2006  Job:  [id number]

## 2011-04-20 NOTE — Consult Note (Signed)
NAME:  Brent Oneal, Brent Oneal                 ACCOUNT NO.:  [id number]   MEDICAL RECORD NO.:  [id number]          PATIENT TYPE:  INP   LOCATION:  6702                         FACILITY:  [facility]   PHYSICIAN:  Clinton D. Maple Hudson, MD, FCCP, FACPDATE OF BIRTH:  September 07, 1966   DATE OF CONSULTATION:  09/28/2006  DATE OF DISCHARGE:                                   CONSULTATION   PROBLEM:  Pulmonary consultation at the kind request of Dr. Roxan Hockey for  this 45 year old male smoker because of persistent hypoxemia.   HISTORY:  This gentleman has a long history of tobacco and occasional  marijuana use, breathing problems and bronchitis.  The present admission  was because of shortness of breath, fever and chills with vomiting over the  past 2 days.  He has been treated with Solu-Medrol, now converting to  prednisone, and with albuterol.  Chest x-ray had shown some hyperinflation  with normal heart size, peribronchial thickening consistent with bronchial  pneumonia, no evidence of pulmonary embolism, mild nonspecific adenopathy.  This was confirmed again as of 09/27/2006, when a portable chest x-ray  showing some increase in patchy and nodular bilateral infiltrates, left  greater than right.  He is ambulatory in the hall.  His baseline is to have  a daily cough, productive of usually clear-to-yellow mucus.  He estimates  exercise tolerance at 1 city block, limited by dyspnea.  Admission oxygen  saturation was 89%.  He had subsequently had oxygen saturations down into  the 70% range, and has been using nasal prongs oxygen.  Admission arterial  blood gases reported a pH of 7.37, pCO2 of 54, pO2 69, bicarbonate 28.   PAST HISTORY:  1. Recurrent left pyelonephritis.  2. Chronic back pain, on methadone.  3. Atrophic left kidney, status post surgery and stent.  4. Right shoulder surgery.  5. Right ankle fracture.  6. Marijuana use.  7. Tobacco use.  8. History of yellow jaundice.  9. He denies tuberculosis  exposure.  10.He says he is probably exposed to HIV and gets himself tested every 3      months, so far negative.  11.Previous double pneumonia in 1999.   MEDICATION ALLERGY:  CELEBREX causes rash.   REVIEW OF SYSTEMS:  Cough, dyspnea.  Admission complaints of nausea,  vomiting and abdominal pain have substantially resolved.   FAMILY HISTORY:  Grandfather and father had breathing problems.  He does  not know of anybody with early need for oxygen or with liver disease.   SOCIAL HISTORY:  He has held a variety of jobs, some with occupational dust  exposures, currently unemployed.  Separated from his wife for 10 years.  Has  a girlfriend.  Smokes 1/2 to 1 pack per day, and smokes pot once in a  while for his nerves.  Hinting rather broadly to me that he would like  Xanax prescribed.   EXAMINATION:  BP 130/68, pulse regular at 56, room air saturation 18.  Oxygen saturation when I was in the room was 98% on 2 L prongs.  On room  air, his saturation was 91%, after  he returned from the bathroom on room  air, and rose to range between 92 and 94% as we talked.  GENERAL APPEARANCE:  Medium build, no distress.  He coughs white phlegm,  which he spits into the trash can.  SKIN:  No rash.  ADENOPATHY:  None at the neck, shoulders or axillae.  HEENT:  Conjunctivae are clear; no neck vein distention; no stridor.  CHEST:  Bilateral wheezy rhonchi, unlabored, without dullness or rub, and  without increased work of breathing.  HEART:  Regular rhythm, no murmur or gallop.  ABDOMEN:  Rather firm, voluntary guarding.  Bowel sounds are heard.  EXTREMITIES:  No cyanosis, clubbing or edema.   LABORATORY:  Radiology, described above.  PPD was negative, and arterial  blood gases on room air on 09/25/2006 were reported as pH 7.36, pCO2 53, pO2  49, bicarbonate 27.  Cultures so far appear to be negative.   IMPRESSION:  1. Immediate concern of resistant hypoxemia appears to have resolved and      likely  represented his acute bronchopneumonia and underlying chronic      obstructive pulmonary disease.  He can remain off of oxygen as long as      scores remain stable.  2. Chronic obstructive pulmonary disease, probably with mixed emphysema      and bronchitis features.  Recommend baseline pulmonary function tests,      when he is stable.  3. Acute community-acquired pneumonia.  I cannot exclude aspiration, since      he was vomiting at the onset.  Current antibiotics have been      appropriate, but consider switching to a p.o. with some atypical      coverage, such as Biaxin, doxycycline or Avalox.  4. He needs active and ongoing smoking cessation and lifestyle counseling.  5. He will need a general medical followup and might benefit from entry      into the clinic for this.  6. It is not likely that he is going to remain compliant with a      complicated medical regimen.  Consider use of an albuterol rescue      inhaler, supplemented with a cortisone maintenance inhaler, if he will      use it.      Clinton D. Maple Hudson, MD, Tonny Bollman, FACP  Electronically Signed     CDY/MEDQ  D:  09/28/2006  T:  09/29/2006  Job:  [id number]   cc:   Michaelyn Barter, M.D.

## 2011-08-23 ENCOUNTER — Institutional Professional Consult (permissible substitution): Payer: Self-pay | Admitting: Pulmonary Disease

## 2011-08-27 IMAGING — CT CT ANGIO CHEST
2 of 7 series · 19 of 36 positions shown · IV contrast (APPLIED)
Comparison: CT 09/26/2006

CLINICAL DATA: COPD exacerbation.  Leukocytosis.  Rule out
pulmonary embolism.

CT ANGIOGRAPHY CHEST WITH CONTRAST
TECHNIQUE: Multidetector CT imaging of the chest was performed
using the standard protocol during bolus administration of
intravenous contrast.  Multiplanar CT image reconstructions
including MIPs were obtained to evaluate the vascular anatomy.
Contrast:  100 ml Rmnipaque-PBB IV

[Series 9: pulm embolism 1.0 b25f thins · axial · 0.71mm/px · z∈[-296,-14]mm · 18 of 314 slices shown]
[im 16/314  lung]
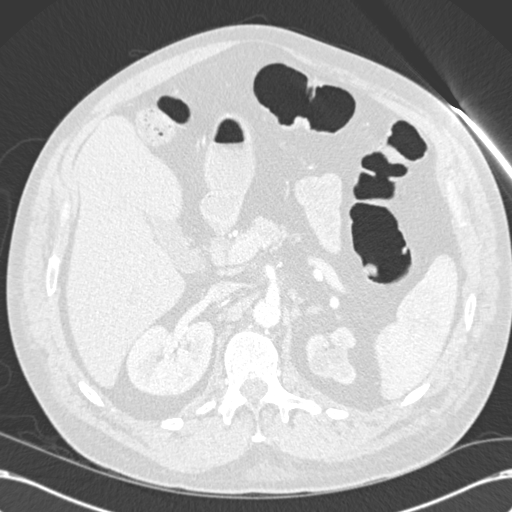
[im 32/314  mediastinal]
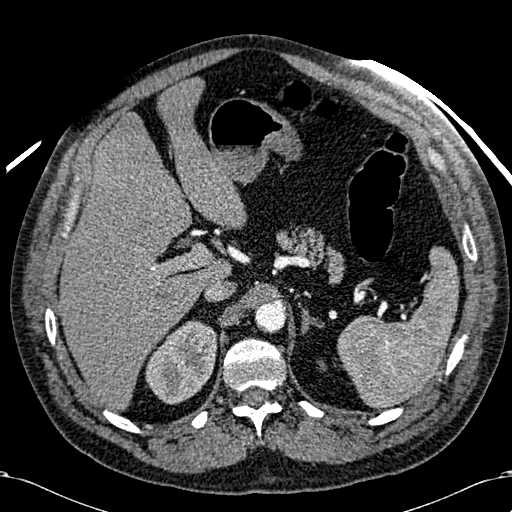
[im 47/314  lung]
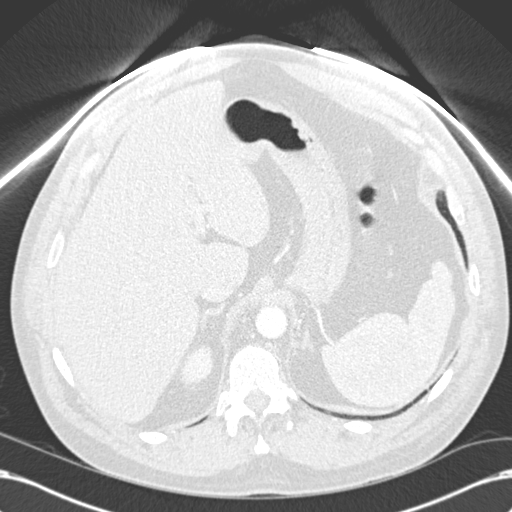
[im 63/314  mediastinal]
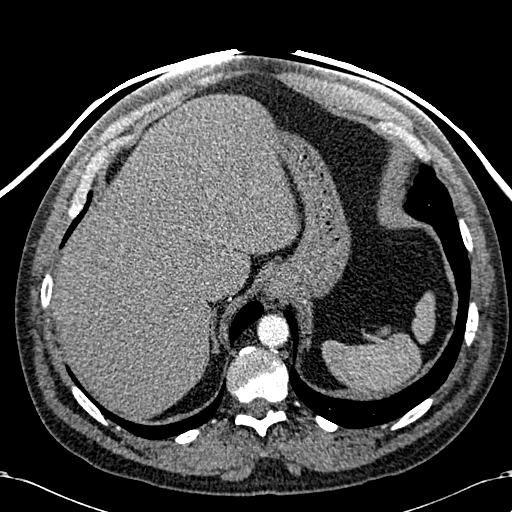
[im 79/314  lung]
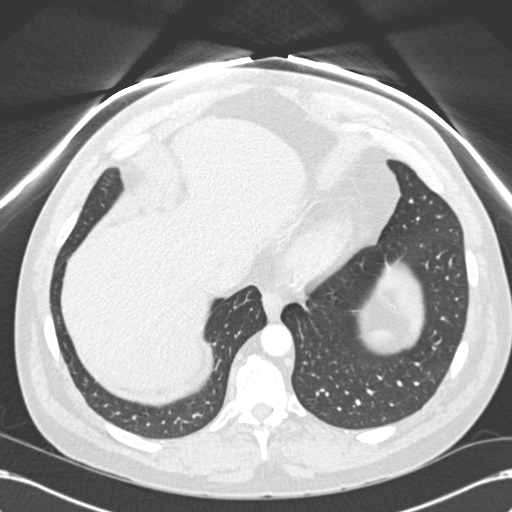
[im 94/314  mediastinal]
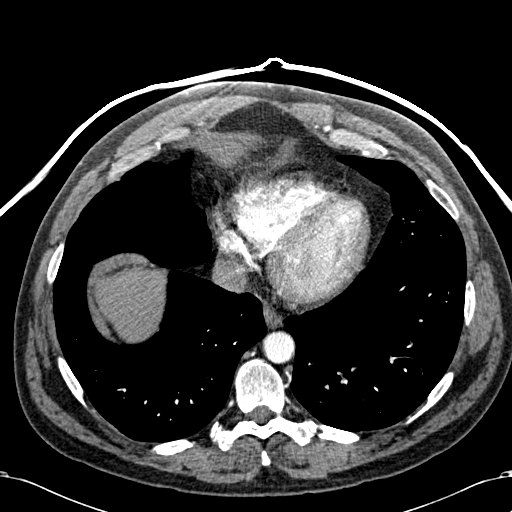
[im 110/314  lung]
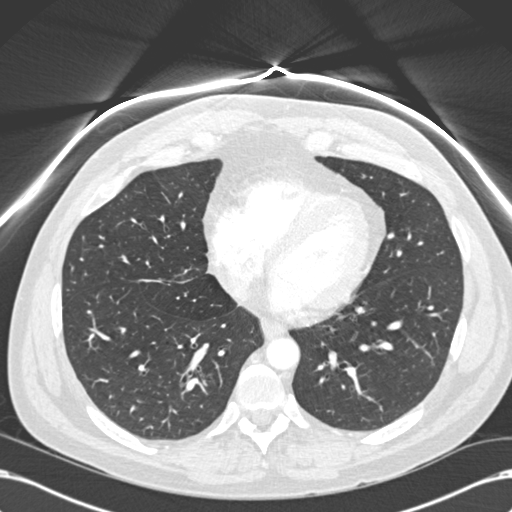
[im 126/314  mediastinal]
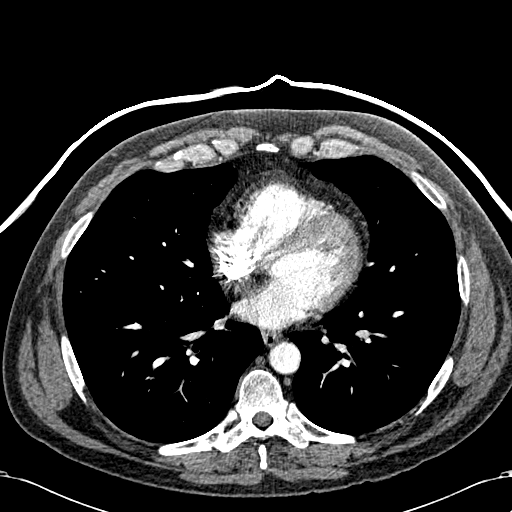
[im 141/314  lung]
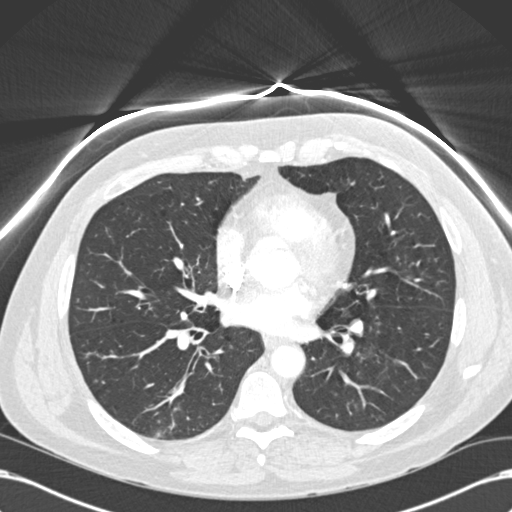
[im 173/314  mediastinal]
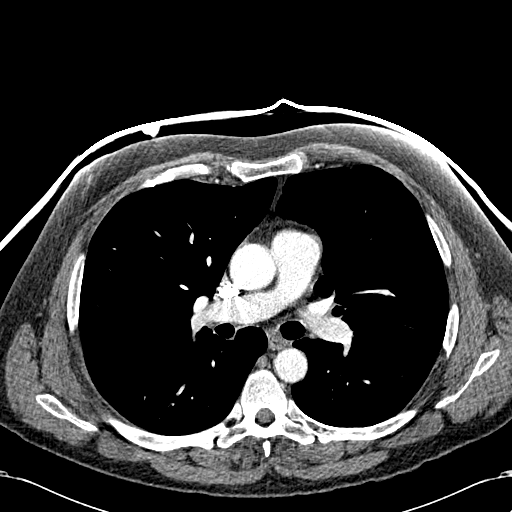
[im 188/314  lung]
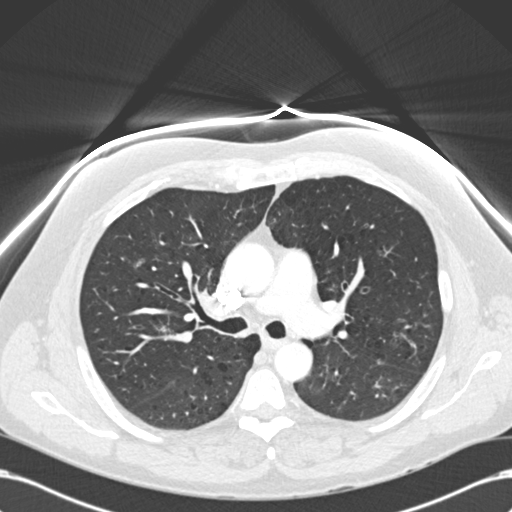
[im 204/314  mediastinal]
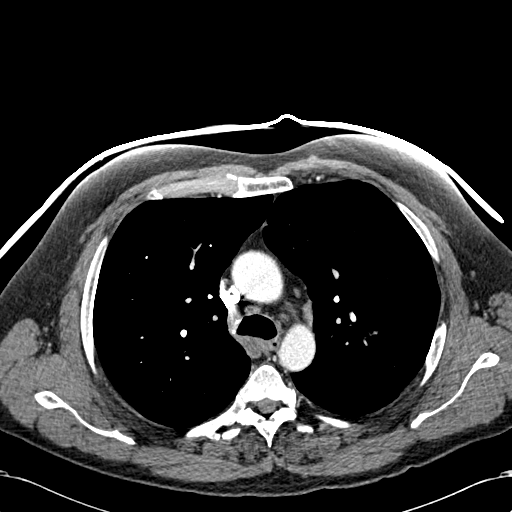
[im 220/314  lung]
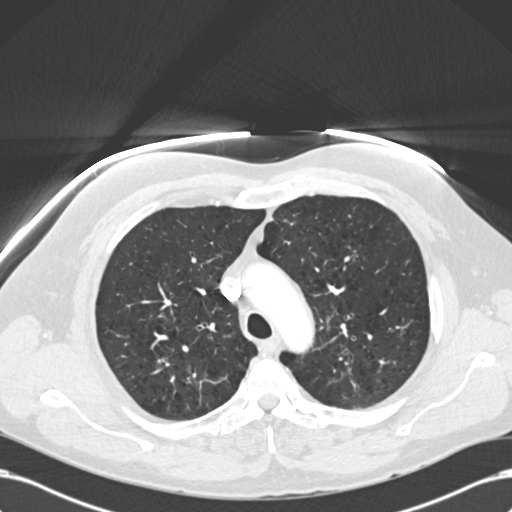
[im 235/314  mediastinal]
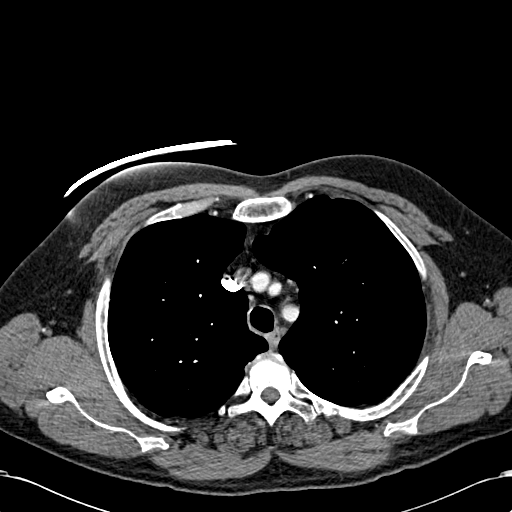
[im 251/314  lung]
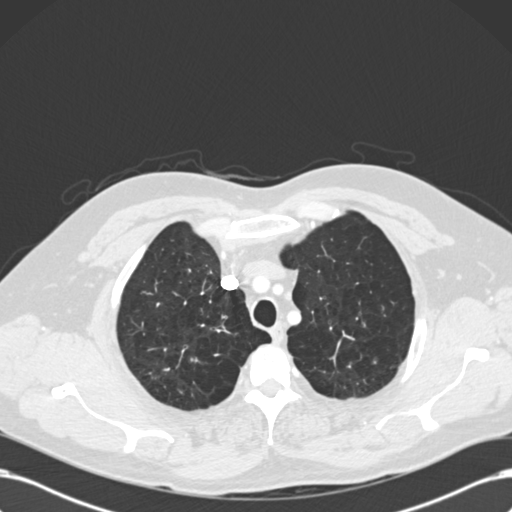
[im 267/314  mediastinal]
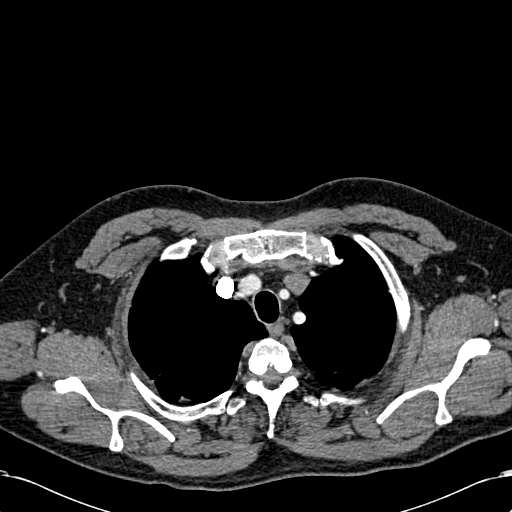
[im 282/314  lung]
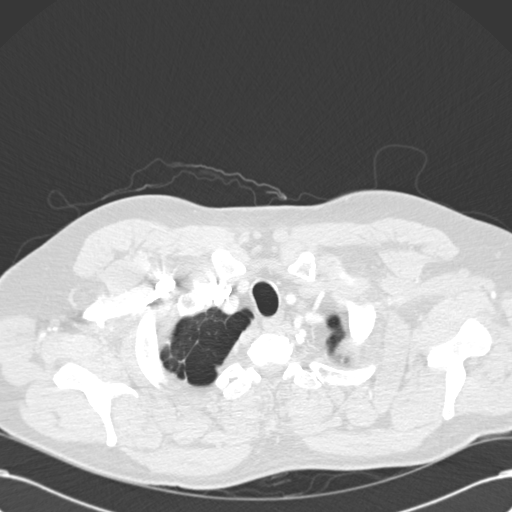
[im 298/314  mediastinal]
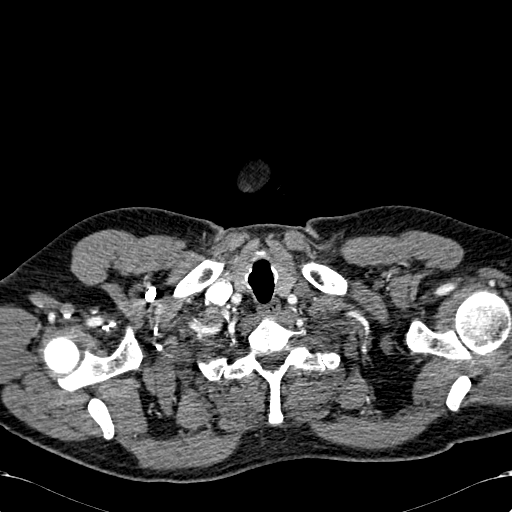

[Series 10: pulm embolism 2.0 spo thins · coronal · 0.69mm/px · 1 of 105 slices shown]
[im 53/105  mediastinal]
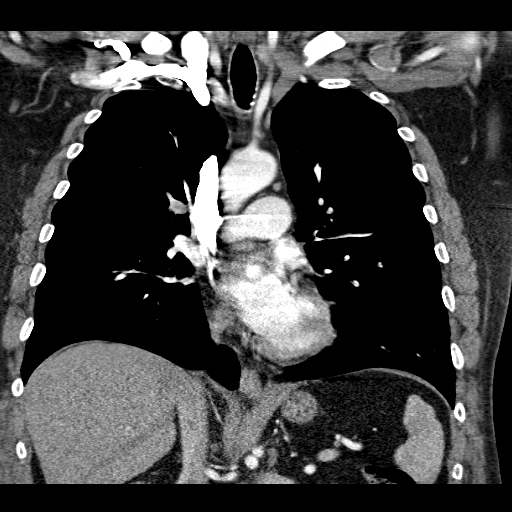

[19 of 36 positions shown; findings below may reference images not displayed]

FINDINGS: Negative for pulmonary emboli.  Negative for aortic
dissection or aneurysm.

Chronic bronchitis with bronchial wall thickening which has
improved since the prior study.  Apical emphysema is present.  Mild
patchy scarring is present in the bases.  No acute infiltrate or
effusion.  Negative for mass or adenopathy.

Small left kidney with decreased excretion and cortical scarring.

Review of the MIP images confirms the above findings.
IMPRESSION: Negative for pulmonary emboli.

Chronic lung disease with chronic bronchitis and emphysema.

Chronic scarring and poor functioning of the left kidney.

## 2011-09-07 ENCOUNTER — Institutional Professional Consult (permissible substitution): Payer: Self-pay | Admitting: Pulmonary Disease

## 2011-09-20 LAB — CBC
HCT: 49.6
Platelets: 253
RBC: 5.66
WBC: 11.7 — ABNORMAL HIGH

## 2011-09-20 LAB — DIFFERENTIAL
Eosinophils Relative: 2
Lymphocytes Relative: 25
Lymphs Abs: 2.9
Monocytes Relative: 9
Neutrophils Relative %: 63

## 2011-09-20 LAB — BASIC METABOLIC PANEL
BUN: 6
Chloride: 95 — ABNORMAL LOW
GFR calc Af Amer: >60
GFR calc non Af Amer: >60
Potassium: 3.1 — ABNORMAL LOW

## 2011-10-02 ENCOUNTER — Ambulatory Visit (INDEPENDENT_AMBULATORY_CARE_PROVIDER_SITE_OTHER): Payer: Medicaid Other | Admitting: Pulmonary Disease

## 2011-10-02 ENCOUNTER — Encounter: Payer: Self-pay | Admitting: Pulmonary Disease

## 2011-10-02 ENCOUNTER — Ambulatory Visit (INDEPENDENT_AMBULATORY_CARE_PROVIDER_SITE_OTHER)
Admission: RE | Admit: 2011-10-02 | Discharge: 2011-10-02 | Disposition: A | Discharge status: Home / Self Care | Payer: Medicaid Other | Source: Ambulatory Visit | Attending: Pulmonary Disease | Admitting: Pulmonary Disease

## 2011-10-02 ENCOUNTER — Other Ambulatory Visit: Payer: Medicaid Other

## 2011-10-02 DIAGNOSIS — J449 Chronic obstructive pulmonary disease, unspecified: Secondary | ICD-10-CM

## 2011-10-02 DIAGNOSIS — Z23 Encounter for immunization: Secondary | ICD-10-CM

## 2011-10-02 DIAGNOSIS — J9610 Chronic respiratory failure, unspecified whether with hypoxia or hypercapnia: Secondary | ICD-10-CM

## 2011-10-02 DIAGNOSIS — J439 Emphysema, unspecified: Secondary | ICD-10-CM | POA: Insufficient documentation

## 2011-10-02 MED ORDER — ALBUTEROL SULFATE HFA 108 (90 BASE) MCG/ACT IN AERS: 2.0000 | INHALATION_SPRAY | Freq: Four times a day (QID) | RESPIRATORY_TRACT | Status: DC | PRN

## 2011-10-02 NOTE — Patient Instructions (Signed)
Change advair to one puff in am and one in pm Stop atrovent Take spiriva one inhalation each am Can use albuterol 2 puffs up to every 6hrs if needed for rescue only Will schedule for breathing tests, and will also check a blood test for a genetic form of emphysema.   Will check cxr today, and call you with results. Will give you a flu shot today. Will see you back same day after your breathing tests. You must stop smoking 100%.

## 2011-10-02 NOTE — Progress Notes (Signed)
  Subjective:    Patient ID: Brent Oneal, male    DOB: 06-30-1966, 45 y.o.   MRN: [id number]  HPI The patient is a 45 year old male who I've been asked to see for management of obstructive lung disease.  The patient states that he has no history of breathing issues during earlier adulthood, but gives a two-year history of worsening shortness of breath.  He has been on oxygen 24 hours a day for the last 2 years, after what he describes as an episode of severe pneumonia.  He has a long history of tobacco use, and continues to smoke currently.  He gives a one block dyspnea on exertion history on flat ground at a moderate pace.  He will get winded bringing groceries in from the car or carrying just about any type of load.  He has no significant cough except primarily in the morning, and usually produces nonpurulent mucus.  He has intermittent hoarseness on and off, that he relates to postnasal drip.  The patient has never had pulmonary function studies, but did have a CT in 2011 that shows significant apical emphysematous changes.  There were also mild changes of bronchiectasis scattered bilaterally.  He does not give a history for recurrent pulmonary infections.  He denies any history of early lung disease in his family.   Review of Systems  Constitutional: Positive for unexpected weight change. Negative for fever.  HENT: Positive for congestion, sore throat and trouble swallowing. Negative for ear pain, nosebleeds, rhinorrhea, sneezing, dental problem, postnasal drip and sinus pressure.   Eyes: Negative for redness and itching.  Respiratory: Positive for shortness of breath. Negative for chest tightness and wheezing.   Cardiovascular: Negative for palpitations and leg swelling.  Gastrointestinal: Positive for abdominal pain. Negative for nausea and vomiting.  Genitourinary: Negative for dysuria.  Musculoskeletal: Negative for joint swelling.  Skin: Negative for rash.  Neurological: Positive for  headaches.  Hematological: Does not bruise/bleed easily.  Psychiatric/Behavioral: Negative for dysphoric mood. The patient is nervous/anxious.        Objective:   Physical Exam Constitutional:  Obese male, no acute distress  HENT:  Nares patent without discharge  Oropharynx without exudate, palate and uvula are large and elongated.  Eyes:  Perrla, eomi, no scleral icterus  Neck:  No JVD, no TMG  Cardiovascular:  Normal rate, regular rhythm, no rubs or gallops.  No murmurs        Intact distal pulses  Pulmonary :  Mildly decreased breath sounds, no stridor or respiratory distress   No rales, rhonchi, or wheezing  Abdominal:  Soft, nondistended, bowel sounds present.  No tenderness noted.   Musculoskeletal:  No lower extremity edema noted.  Lymph Nodes:  No cervical lymphadenopathy noted  Skin:  No cyanosis noted  Neurologic:  Alert, appropriate, moves all 4 extremities without obvious deficit.         Assessment & Plan:

## 2011-10-02 NOTE — Assessment & Plan Note (Signed)
The patient has long-standing breathing issues that are worsening over the last 2 years, and I suspect he does have chronic obstructive lung disease.  He has never had PFTs for verification, but his chest CT from 2011 does show significant emphysematous changes in the upper lobes.  At this point, I would like to intensify his bronchodilator regimen, and will also need to schedule for PFTs and a chest x-ray.  Will also check an alpha one antitrypsin level.  I have counseled the patient extensively about total smoking cessation.

## 2011-10-15 ENCOUNTER — Telehealth: Payer: Self-pay | Admitting: Pulmonary Disease

## 2011-10-15 MED ORDER — FLUTICASONE-SALMETEROL 250-50 MCG/DOSE IN AEPB: INHALATION_SPRAY | RESPIRATORY_TRACT | Status: DC

## 2011-10-15 NOTE — Telephone Encounter (Signed)
Ok to fill with 6 fills.  

## 2011-10-15 NOTE — Telephone Encounter (Signed)
Kc, are you okay with sending Advair refill for patient-was seen in consult with you on 10-02-2011. Thanks.

## 2011-10-15 NOTE — Telephone Encounter (Signed)
I spoke with pt and he is aware rx for advair was sent to pharmacy. Pt needed nothing further

## 2011-10-22 ENCOUNTER — Ambulatory Visit (INDEPENDENT_AMBULATORY_CARE_PROVIDER_SITE_OTHER): Payer: Medicaid Other | Admitting: Pulmonary Disease

## 2011-10-22 ENCOUNTER — Encounter (INDEPENDENT_AMBULATORY_CARE_PROVIDER_SITE_OTHER): Payer: Medicaid Other

## 2011-10-22 ENCOUNTER — Encounter: Payer: Self-pay | Admitting: Pulmonary Disease

## 2011-10-22 VITALS — BP 130/82 | HR 89 | Temp 98.6°F | Ht 68.0 in | Wt 202.0 lb

## 2011-10-22 DIAGNOSIS — J9610 Chronic respiratory failure, unspecified whether with hypoxia or hypercapnia: Secondary | ICD-10-CM

## 2011-10-22 DIAGNOSIS — J449 Chronic obstructive pulmonary disease, unspecified: Secondary | ICD-10-CM

## 2011-10-22 LAB — PULMONARY FUNCTION TEST

## 2011-10-22 MED ORDER — BUDESONIDE-FORMOTEROL FUMARATE 160-4.5 MCG/ACT IN AERO: 2.0000 | INHALATION_SPRAY | Freq: Two times a day (BID) | RESPIRATORY_TRACT | Status: DC

## 2011-10-22 MED ORDER — TIOTROPIUM BROMIDE MONOHYDRATE 18 MCG IN CAPS: 18.0000 ug | ORAL_CAPSULE | Freq: Every day | RESPIRATORY_TRACT | Status: DC

## 2011-10-22 MED ORDER — IPRATROPIUM BROMIDE HFA 17 MCG/ACT IN AERS: 2.0000 | INHALATION_SPRAY | Freq: Four times a day (QID) | RESPIRATORY_TRACT | Status: DC

## 2011-10-22 NOTE — Assessment & Plan Note (Signed)
The patient has very severe airflow obstruction on his pulmonary function studies, but does have a 30% increase in FEV1 with bronchodilators.  He obviously has very severe emphysema given his DLCO of 38%.  He is already on Advair and Spiriva, but I would like to change his Advair to use symbicort given the faster onset of formoterol.  I have had a very candid discussion with him today about the severity of his lung disease, and that more than likely he will need a transplant in the future.  I have recommended total smoking cessation, an aggressive weight loss program, and have also recommended referral to the pulmonary rehabilitation program at the local hospital.  He will consider all of these.  I have also stressed the need to stay on oxygen 24 hours a day.  He will followup with me in 4 months, or sooner if having issues.

## 2011-10-22 NOTE — Progress Notes (Signed)
  Subjective:    Patient ID: Brent Oneal, male    DOB: Nov 11, 1966, 45 y.o.   MRN: [id number]  HPI The patient comes in today for followup after his recent pulmonary function studies.  He was found to have very severe airflow obstruction, but a 32% increase in FEV1 with bronchodilator.  He was also found to have significant airtrapping, and a diffusion capacity severely reduced at 38% of predicted.  I have reviewed the studies with him in detail, and answered all of his questions.   Review of Systems  Constitutional: Negative for fever and unexpected weight change.  HENT: Negative for ear pain, nosebleeds, congestion, sore throat, rhinorrhea, sneezing, trouble swallowing, dental problem, postnasal drip and sinus pressure.   Eyes: Negative for redness and itching.  Respiratory: Positive for cough, chest tightness, shortness of breath and wheezing.   Cardiovascular: Negative for palpitations and leg swelling.  Gastrointestinal: Negative for nausea and vomiting.  Genitourinary: Negative for dysuria.  Musculoskeletal: Negative for joint swelling.  Skin: Negative for rash.  Neurological: Negative for headaches.  Hematological: Does not bruise/bleed easily.  Psychiatric/Behavioral: Negative for dysphoric mood. The patient is not nervous/anxious.        Objective:   Physical Exam Obese male in no acute distress Nose without purulence or discharge noted Chest with decreased breath sounds, no wheezes or rhonchi Cardiac exam is regular rate and rhythm Lower extremities without edema, no cyanosis noted Alert and oriented, moves all 4 extremities.       Assessment & Plan:

## 2011-10-22 NOTE — Patient Instructions (Signed)
Stay on spiriva once a day regularly Change advair to symbicort 160/4.5  2 inhalations every am and pm everyday.  Rinse mouth well Use atrovent inhaler 2 puffs up to every 6hrs for rescue only.  Do not use regularly.  Stop ventolin.  You must stop smoking 100%!! Think about referral to a pulmonary exercise program at [facility].  Let us know Stay on oxygen. followup with me in 4mos.

## 2011-10-30 ENCOUNTER — Telehealth: Payer: Self-pay | Admitting: Pulmonary Disease

## 2011-10-30 NOTE — Telephone Encounter (Signed)
I spoke with pt and he states he has 2 boxes of spiriva and 1 box of his symbicort and wants to know if he can finish that up before he picks up his rx from the pharmacy. I advised pt he can call pharmacy and advise them to hold his rx until he finishes his sample if he wants and then pick up the rx from the pharmacy. Pt states he will do that then

## 2011-11-29 ENCOUNTER — Encounter (HOSPITAL_COMMUNITY): Payer: Self-pay | Admitting: Emergency Medicine

## 2011-11-29 ENCOUNTER — Emergency Department (HOSPITAL_COMMUNITY): Payer: Medicaid Other

## 2011-11-29 ENCOUNTER — Emergency Department (HOSPITAL_COMMUNITY)
Admission: EM | Admit: 2011-11-29 | Discharge: 2011-11-30 | Disposition: A | Discharge status: Home / Self Care | Payer: Medicaid Other | Attending: Emergency Medicine | Admitting: Emergency Medicine

## 2011-11-29 DIAGNOSIS — Z9981 Dependence on supplemental oxygen: Secondary | ICD-10-CM | POA: Insufficient documentation

## 2011-11-29 DIAGNOSIS — J441 Chronic obstructive pulmonary disease with (acute) exacerbation: Secondary | ICD-10-CM | POA: Insufficient documentation

## 2011-11-29 DIAGNOSIS — F17200 Nicotine dependence, unspecified, uncomplicated: Secondary | ICD-10-CM | POA: Insufficient documentation

## 2011-11-29 DIAGNOSIS — R6889 Other general symptoms and signs: Secondary | ICD-10-CM | POA: Insufficient documentation

## 2011-11-29 DIAGNOSIS — Z79899 Other long term (current) drug therapy: Secondary | ICD-10-CM | POA: Insufficient documentation

## 2011-11-29 HISTORY — DX: Chronic obstructive pulmonary disease, unspecified: J44.9

## 2011-11-29 HISTORY — DX: Essential (primary) hypertension: I10

## 2011-11-29 MED ORDER — ALBUTEROL SULFATE (5 MG/ML) 0.5% IN NEBU
5.0000 mg | INHALATION_SOLUTION | Freq: Once | RESPIRATORY_TRACT | Status: AC
  Administered 2011-11-30: 5 mg via RESPIRATORY_TRACT
  Filled 2011-11-29: qty 1

## 2011-11-29 MED ORDER — ALBUTEROL SULFATE (5 MG/ML) 0.5% IN NEBU
INHALATION_SOLUTION | RESPIRATORY_TRACT | Status: AC
  Administered 2011-11-30: 5 mg via RESPIRATORY_TRACT
  Filled 2011-11-29: qty 1

## 2011-11-29 MED ORDER — IPRATROPIUM BROMIDE 0.02 % IN SOLN
RESPIRATORY_TRACT | Status: AC
  Administered 2011-11-30: 0.5 mg via RESPIRATORY_TRACT
  Filled 2011-11-29: qty 2.5

## 2011-11-29 MED ORDER — PREDNISONE 20 MG PO TABS
60.0000 mg | ORAL_TABLET | Freq: Once | ORAL | Status: AC
  Administered 2011-11-29: 60 mg via ORAL
  Filled 2011-11-29: qty 3

## 2011-11-29 MED ORDER — MOXIFLOXACIN HCL 400 MG PO TABS
400.0000 mg | ORAL_TABLET | Freq: Once | ORAL | Status: AC
  Administered 2011-11-29: 400 mg via ORAL
  Filled 2011-11-29: qty 1

## 2011-11-29 MED ORDER — IPRATROPIUM BROMIDE 0.02 % IN SOLN
0.5000 mg | Freq: Once | RESPIRATORY_TRACT | Status: AC
  Administered 2011-11-30: 0.5 mg via RESPIRATORY_TRACT
  Filled 2011-11-29: qty 2.5

## 2011-11-29 NOTE — ED Notes (Signed)
MD at bedside. 

## 2011-11-29 NOTE — ED Provider Notes (Signed)
History     CSN: [id number]  Arrival date & time 11/29/11  1940   First MD Initiated Contact with Patient 11/29/11 2109      Chief Complaint  Patient presents with  . Influenza    (Consider location/radiation/quality/duration/timing/severity/associated sxs/prior treatment) HPI  Patient presents with complaint of increase in his baseline wheezing and cough over the past 2 days. He also complains of subjective fever. He has COPD and uses 3 L of oxygen by nasal cannula 24 hours per day. He notes that he has had increased sputum production and increased cough. He uses albuterol Atrovent and Flovent at home. His inhalers did not provide very much relief for his symptoms which prompted ED evaluation. He denies any chest pain. He's had no vomiting or diarrhea.  He has continued to drink a normal non-of liquids. There no other alleviating or modifying factors. There no other associated systemic symptoms.  He has not had any recent systemic steroid use.   Past Medical History  Diagnosis Date  . Hyperlipidemia   . Allergic   . COPD (chronic obstructive pulmonary disease)   . Hypertension     Past Surgical History  Procedure Date  . Kidney surgery     L  . Shoulder surgery     R  . Knee surgery     L    Family History  Problem Relation Age of Onset  . Emphysema Father   . Allergies Maternal Grandmother   . Allergies Maternal Grandfather   . Asthma Father   . Heart attack Father   . Bone cancer Mother     History  Substance Use Topics  . Smoking status: Current Everyday Smoker -- 0.5 packs/day for 30 years    Types: Cigarettes  . Smokeless tobacco: Not on file   Comment: currently smoking 1 cig daily  . Alcohol Use: Yes      Review of Systems ROS reviewed and otherwise negative except for mentioned in HPI  Allergies  Amitriptyline and Celecoxib  Home Medications   Current Outpatient Rx  Name Route Sig Dispense Refill  . ALBUTEROL SULFATE HFA 108 (90 BASE)  MCG/ACT IN AERS Inhalation Inhale 2 puffs into the lungs every 6 (six) hours as needed. For shortness of breath.     . BUDESONIDE-FORMOTEROL FUMARATE 160-4.5 MCG/ACT IN AERO Inhalation Inhale 2 puffs into the lungs 2 (two) times daily.      . IPRATROPIUM BROMIDE HFA 17 MCG/ACT IN AERS Inhalation Inhale 2 puffs into the lungs every 6 (six) hours.      . METHADONE HCL PO Oral Take 145 mg by mouth daily.      Marland Kitchen TIOTROPIUM BROMIDE MONOHYDRATE 18 MCG IN CAPS Inhalation Place 18 mcg into inhaler and inhale daily.      Marland Kitchen DOXYCYCLINE HYCLATE 100 MG PO CAPS Oral Take 1 capsule (100 mg total) by mouth 2 (two) times daily. 20 capsule 0  . PREDNISONE 10 MG PO TABS Oral Take 5 tablets (50 mg total) by mouth daily. 5 tablet 0    BP 113/75  Pulse 92  Temp(Src) 97.8 F (36.6 C) (Oral)  Resp 18  SpO2 94% Vitals reviewed Physical Exam Physical Examination: General appearance - alert, well appearing, and in no distress Mental status - alert, oriented to person, place, and time Mouth - mucous membranes moist, pharynx normal without lesions Chest - diffuse bilateral expiratory wheezing, no rales or rhonchi, symmetric air entry, no retractions or increased respiratory effort Heart - normal rate, regular  rhythm, normal S1, S2, no murmurs, rubs, clicks or gallops Abdomen - soft, nontender, nondistended, no masses or organomegaly Neurological - alert, oriented, normal speech, no focal findings Musculoskeletal - no joint tenderness, deformity or swelling Extremities - peripheral pulses normal, no pedal edema, no clubbing or cyanosis Skin - normal coloration and turgor, no rashes  ED Course  Procedures (including critical care time)  11:56 PM- pt reassessed, wheezing improved.  Pt comfortable on recheck.  Very minimal wheezing.    Labs Reviewed - No data to display Dg Chest 2 View  11/29/2011  *RADIOLOGY REPORT*  Clinical Data: Cough and SOB  CHEST - 2 VIEW  Comparison: 10/02/2011  Findings: Heart size is  normal.  There is no pleural effusion or pulmonary edema.  No airspace consolidation identified.  Chronic interstitial coarsening is noted bilaterally.  There is no airspace consolidation.  Review of the visualized osseous structures is negative.  IMPRESSION:  1.  No acute cardiopulmonary abnormalities.  Original Report Authenticated By: Rosealee Albee, M.D.     1. COPD exacerbation   2. Influenza-like illness       MDM  Patient with history of COPD and chronic home oxygen he is presenting with wheezing and increase in his baseline cough. Chest x-ray had no acute abnormalities. He did have moderate bilateral wheezing on exam. He was treated with breathing treatments as well as steroids and moxifloxacin. On reassessment his wheezing was much improved. His vital signs are reassuring. He is nontoxic and well-hydrated. Patient will be discharged with a course of steroids for COPD exacerbation. He is agreeable with the plan for discharge and was given strict return precautions        Ethelda Chick, MD 11/30/11 [phone]

## 2011-11-30 ENCOUNTER — Telehealth: Payer: Self-pay | Admitting: Pulmonary Disease

## 2011-11-30 MED ORDER — PREDNISONE 10 MG PO TABS: ORAL_TABLET | ORAL | Status: DC

## 2011-11-30 MED ORDER — PREDNISONE 10 MG PO TABS: 50.0000 mg | ORAL_TABLET | Freq: Every day | ORAL | Status: DC

## 2011-11-30 MED ORDER — DOXYCYCLINE HYCLATE 100 MG PO CAPS: 100.0000 mg | ORAL_CAPSULE | Freq: Two times a day (BID) | ORAL | Status: AC

## 2011-11-30 NOTE — Telephone Encounter (Signed)
Called and spoke with pt and he is aware of RA recs to start on pred taper tomorrow since he took the pred tablets this am.  Pt is aware that if he starts to feel worse or feels disoriented that he should be seen in the er.  Pt is also aware to call our office next week if not better for appt next week.  Pt voiced his understanding of this.

## 2011-11-30 NOTE — ED Notes (Signed)
waitiing on [facility] ambulance ride home.

## 2011-11-30 NOTE — Telephone Encounter (Signed)
Pred 10 mg  -Take 4 tabs  daily with food x 4 days, then 3 tabs daily x 4 days, then 2 tabs daily x 4 days, then 1 tab daily x4 days then stop. #40 Call if no better by next wk

## 2011-11-30 NOTE — Telephone Encounter (Signed)
I spoke with pt and he states he went to the ED last night for brinchitis and was given 5 tablets of prednisone and doxycyline. Pt states he has finished the prednisone. Pt c/o increase SOB w/ exertion, very little cough, occasional wheezing. Pt is using 3-4 liters of oxygen and his sat's are ranging from 90-92% on oxygen. Pt is requesting further recs. Since [facility] is not in will forward to Dr. Vassie Loll for recs. Please advise, thanks  Allergies  Allergen Reactions  . Amitriptyline   . Celecoxib     CELEBREX   REACTION: rash

## 2011-12-14 ENCOUNTER — Telehealth: Payer: Self-pay | Admitting: Pulmonary Disease

## 2011-12-14 NOTE — Telephone Encounter (Signed)
Called and spoke with pt and he stated that he finished the doxycycline and prednisone and he is now having nausea,  And feels under the weather.  He checked his temp and it was 98.2.  Feels tired and run down.  Pt wanted to know if this is a side effect from  The medicine.  KC please advise. Thanks  Allergies  Allergen Reactions  . Amitriptyline   . Celecoxib     CELEBREX   REACTION: rash

## 2011-12-14 NOTE — Telephone Encounter (Signed)
Called and spoke with pt and he is aware of KC recs.  Pt voiced his understanding and advised that if he feels worse over the weekend then to be seen at [facility] or ER.

## 2011-12-14 NOTE — Telephone Encounter (Signed)
Let him know this is not a medication side effect.  He has significant lung disease, and when he gets sick, it takes him longer to bounce back with respect to energy level, activity, etc.

## 2012-01-28 ENCOUNTER — Telehealth: Payer: Self-pay | Admitting: Pulmonary Disease

## 2012-01-28 MED ORDER — FLUTICASONE-SALMETEROL 250-50 MCG/DOSE IN AEPB: 1.0000 | INHALATION_SPRAY | Freq: Two times a day (BID) | RESPIRATORY_TRACT | Status: DC

## 2012-01-28 NOTE — Telephone Encounter (Signed)
I spoke with pt and advised him of kc recs. He verbalized understanding that he is to be on advair and spiriva and needs to STOP the symbicort. He needed an rx for the advair sent to [address]. I have sent rx and nothing further was needed

## 2012-01-28 NOTE — Telephone Encounter (Signed)
Let pt know that it is ok to go back on advair, but do not take symbicort with this.  He is to be on advair and spiriva.

## 2012-01-28 NOTE — Telephone Encounter (Signed)
Pt states the symbicort does not help his breathing and he started back using his advair with his symbicort and then he feels like he can breathe. I advised pt he was suppose to stop the advair not take them together. He states he feels then he needs to go back on the advair bc it helped him more than the symbicort. Pt is requesting to go back on the advair. Please advise Dr. Shelle Iron, thanks

## 2012-02-18 ENCOUNTER — Ambulatory Visit: Payer: Medicaid Other | Admitting: Pulmonary Disease

## 2012-02-28 ENCOUNTER — Encounter: Payer: Self-pay | Admitting: Pulmonary Disease

## 2012-02-28 ENCOUNTER — Ambulatory Visit (INDEPENDENT_AMBULATORY_CARE_PROVIDER_SITE_OTHER): Payer: Medicaid Other | Admitting: Pulmonary Disease

## 2012-02-28 VITALS — BP 128/82 | HR 99 | Temp 98.1°F | Ht 68.0 in | Wt 200.0 lb

## 2012-02-28 DIAGNOSIS — J449 Chronic obstructive pulmonary disease, unspecified: Secondary | ICD-10-CM

## 2012-02-28 DIAGNOSIS — J9610 Chronic respiratory failure, unspecified whether with hypoxia or hypercapnia: Secondary | ICD-10-CM

## 2012-02-28 NOTE — Patient Instructions (Signed)
No change in current medications Work on weight reduction.  It will help your breathing Stop smoking 100% Will refer you to the pulmonary rehab program at [facility].  If doing well, followup with me in 6mos.

## 2012-02-28 NOTE — Assessment & Plan Note (Signed)
The patient is maintaining a stable baseline on his current medications, but unfortunately continues to smoke.  I have asked him to stop smoking 100%, and have encouraged him to enroll in a pulmonary rehabilitation program.  The patient states he is willing to do this.  I have also asked him to continue on his current bronchodilator regimen.

## 2012-02-28 NOTE — Progress Notes (Signed)
  Subjective:    Patient ID: Brent Oneal, male    DOB: 1966/08/19, 46 y.o.   MRN: [id number]  HPI The patient comes in today for followup of his severe lung disease.  He is maintaining on a good bronchodilator regimen, and has not had an acute exacerbation since his last visit.  He did not do well on symbicort, but has returned to baseline since being back on Advair.  He denies any significant chest congestion her mucus.  He continues to be very weak and deconditioned.   Review of Systems  Constitutional: Negative for fever and unexpected weight change.  HENT: Positive for nosebleeds and sinus pressure. Negative for ear pain, congestion, sore throat, rhinorrhea, sneezing, trouble swallowing, dental problem and postnasal drip.   Eyes: Negative for redness and itching.  Respiratory: Negative for cough, chest tightness, shortness of breath and wheezing.   Cardiovascular: Negative for palpitations and leg swelling.  Gastrointestinal: Negative for nausea and vomiting.  Genitourinary: Negative for dysuria.  Musculoskeletal: Negative for joint swelling.  Skin: Negative for rash.  Neurological: Negative for headaches.  Hematological: Does not bruise/bleed easily.  Psychiatric/Behavioral: Negative for dysphoric mood. The patient is not nervous/anxious.        Objective:   Physical Exam Overweight male in no acute distress Nose without purulence or discharge noted Chest with mild decrease in breath sounds, but no wheezing or rhonchi Cardiac exam with regular rate and rhythm Lower extremities without edema, no cyanosis noted Alert and oriented, moves all 4 extremities.       Assessment & Plan:

## 2012-03-13 ENCOUNTER — Encounter (HOSPITAL_COMMUNITY): Payer: Self-pay | Admitting: Nurse Practitioner

## 2012-03-13 ENCOUNTER — Emergency Department (HOSPITAL_COMMUNITY)
Admission: EM | Admit: 2012-03-13 | Discharge: 2012-03-13 | Disposition: A | Discharge status: Home / Self Care | Payer: Medicaid Other | Attending: Emergency Medicine | Admitting: Emergency Medicine

## 2012-03-13 DIAGNOSIS — J4489 Other specified chronic obstructive pulmonary disease: Secondary | ICD-10-CM | POA: Insufficient documentation

## 2012-03-13 DIAGNOSIS — B001 Herpesviral vesicular dermatitis: Secondary | ICD-10-CM

## 2012-03-13 DIAGNOSIS — E785 Hyperlipidemia, unspecified: Secondary | ICD-10-CM | POA: Insufficient documentation

## 2012-03-13 DIAGNOSIS — B009 Herpesviral infection, unspecified: Secondary | ICD-10-CM | POA: Insufficient documentation

## 2012-03-13 DIAGNOSIS — F17200 Nicotine dependence, unspecified, uncomplicated: Secondary | ICD-10-CM | POA: Insufficient documentation

## 2012-03-13 DIAGNOSIS — I10 Essential (primary) hypertension: Secondary | ICD-10-CM | POA: Insufficient documentation

## 2012-03-13 DIAGNOSIS — J449 Chronic obstructive pulmonary disease, unspecified: Secondary | ICD-10-CM | POA: Insufficient documentation

## 2012-03-13 MED ORDER — PENCICLOVIR 1 % EX CREA: TOPICAL_CREAM | CUTANEOUS | Status: DC

## 2012-03-13 NOTE — ED Provider Notes (Addendum)
History     CSN: [id number]  Arrival date & time 03/13/12  1145   First MD Initiated Contact with Patient 03/13/12 1213      Chief Complaint  Patient presents with  . Oral Swelling    HPI C/o upper lip swelling onset last night. Denies any injuries. Describes as uncomfortable especially when eating  Past Medical History  Diagnosis Date  . Hyperlipidemia   . Allergic   . COPD (chronic obstructive pulmonary disease)   . Hypertension     Past Surgical History  Procedure Date  . Kidney surgery     L  . Shoulder surgery     R  . Knee surgery     L    Family History  Problem Relation Age of Onset  . Emphysema Father   . Allergies Maternal Grandmother   . Allergies Maternal Grandfather   . Asthma Father   . Heart attack Father   . Bone cancer Mother     History  Substance Use Topics  . Smoking status: Current Everyday Smoker -- 0.0 packs/day for 30 years    Types: Cigarettes  . Smokeless tobacco: Not on file   Comment: currently smoking 1 cig daily  . Alcohol Use: No      Review of Systems  All other systems reviewed and are negative.    Allergies  Amitriptyline and Celecoxib  Home Medications   Current Outpatient Rx  Name Route Sig Dispense Refill  . FLUTICASONE-SALMETEROL 250-50 MCG/DOSE IN AEPB Inhalation Inhale 1 puff into the lungs every 12 (twelve) hours.    . IPRATROPIUM BROMIDE HFA 17 MCG/ACT IN AERS Inhalation Inhale 2 puffs into the lungs every 6 (six) hours as needed. For wheezing/shortness of breath    . METHADONE HCL PO Oral Take 145 mg by mouth daily. Takes throughout the day.    Marland Kitchen TIOTROPIUM BROMIDE MONOHYDRATE 18 MCG IN CAPS Inhalation Place 18 mcg into inhaler and inhale daily.     Marland Kitchen PENCICLOVIR 1 % EX CREA Topical Apply topically every 2 (two) hours. 1.5 g 0    BP 152/96  Pulse 98  Temp(Src) 98.7 F (37.1 C) (Oral)  Resp 20  Ht 5\' 8"  (1.727 m)  Wt 200 lb (90.719 kg)  BMI 30.41 kg/m2  SpO2 94%  Physical Exam  Nursing note  and vitals reviewed. Constitutional: He is oriented to person, place, and time. He appears well-developed and well-nourished. No distress.  HENT:  Head: Normocephalic and atraumatic.  Mouth/Throat:    Eyes: Pupils are equal, round, and reactive to light.  Neck: Normal range of motion.  Cardiovascular: Normal rate and intact distal pulses.   Pulmonary/Chest: No respiratory distress.  Abdominal: Normal appearance. He exhibits no distension.  Musculoskeletal: Normal range of motion.  Neurological: He is alert and oriented to person, place, and time. No cranial nerve deficit.  Skin: Skin is warm and dry. No rash noted.  Psychiatric: He has a normal mood and affect. His behavior is normal.    ED Course  Procedures (including critical care time)  Labs Reviewed - No data to display No results found.   1. Herpes labialis       MDM          Nelia Shi, MD 03/13/12 1222  Nelia Shi, MD 03/13/12 1224

## 2012-03-13 NOTE — ED Notes (Signed)
C/o upper lip swelling onset last night. Denies any injuries. Describes as uncomfortable especially when eating

## 2012-03-13 NOTE — Discharge Instructions (Signed)
Fever Blisters, Herpes Simplex Herpes simplex is a virus. This virus causes fever blisters or cold sores. Fever blisters are small sores on the lips, gums, or roof of the mouth. People often get infected with this herpes virus but do not have any symptoms. The blisters may break out when a person is:  Tired.   Under stress.   Suffering from another infection (such as a cold).   Exposed to sunlight.  The blisters usually heal within 1 week. The virus can be easily passed to other people and to other parts of the body, such as the eyes and sex organs. CAUSES  A virus, herpes simplex, is the cause of fever blisters. This virus can be passed (transmitted) from person to person and is therefore contagious. There are 2 types of herpes simplex virus. Type 1 usually causes oral herpes or fever blisters. Type 2 usually causes genital herpes. Both viruses do have the potential to cause oral and genital infections. However, the type 1 virus causes more than 90% of recurrent fever blister outbreaks.  Herpes simplex virus is highly contagious when fever blisters are present. Close contact, including kissing, can spread the virus. Children often become infected by contact with others who have fever blisters. A child can spread the virus by rubbing the cold sore and touching other children or when other children touch clothing, wipes, or toys contaminated by an infected child with the virus. In adults, about 10% of oral herpes infections are from oral-genital sex with a person who has active genital herpes (type 2).  Type 1 herpes infection is very common, eventually occurring in up to 8 out of 10 otherwise healthy people. Most people become infected before they are 46 years old. The virus usually infects the lips, throat, or mouth. Initial infection in children can be extensive with many lesions throughout the mouth. In adults, the first infection may cause no symptoms. Some adults may develop many fluid-filled  blisters inside and outside the mouth 3 to 5 days after they are initially infected but severe infection is uncommon. Fever, swollen neck glands, and general aches may occur but this is also uncommon. The blisters tend to come together and then collapse. When on the lip, a yellowish crust forms over the sores. Healing of the area without scarring typically occurs within 2 weeks. Once a person is infected, the herpes virus permanently remains alive in the body within a nerve near the cheekbone. It then stays inactive at this site, only to sometimes travel down the nerve to the skin. This causes a recurrence of fever blisters. Recurrent blisters usually break out at the outside edge of the lip or edge of the nostril. Recurrent fever blisters may occasionally occur on the chin, cheeks, or inside the mouth. Recurrent fever blister attacks are usually not as painful and not as numerous as the first infection. Recurrences are less frequent after age 35. Many people who have recurring fever blisters feel itching, tingling, or burning at the lip border. This can occur hours or a couple days before the blister appears.  Factors which weaken the body's immune system may trigger an outbreak or recurrence of herpes. These include some drugs (such as steroids), emotional stress, fever, illness, sleep deprivation, and other injuries. Sunlight may also trigger an outbreak. Many women have recurrences only during their menstrual period.  TREATMENT There is no cure for fever blisters. There is no vaccine for herpes simplex virus.  Certain medicines can relieve some of the pain   and discomfort of the sores or promote more rapid healing. These include ointments that numb the blisters and medicines that control bacterial infections (antibiotics). A number of drugs active against herpes viruses (antivirals), either applied locally as a gel or cream, or taken in pill form, may promote healing by keeping the virus from multiplying  and infecting more local tissue.   Keep fever blisters clean and dry. This helps to prevent bacterial invasion of the virally infected tissues.   Eat a soft, bland diet to avoid irritating the sores.   Be careful not to touch the sores and spread the virus to new sites, such as:   Other areas of the face.   Eyes.   Genitals.   Make sure you do not infect others. Avoid kissing people when a fever blister is present. Avoid touching the sores and then touching others.   Sunscreen on the lips can prevent recurrences if outbreaks are triggered by sunlight. The sunscreen should be put on before going outside and reapplied often while in the sun.   Avoid stress if this seems to cause outbreaks.  HOME CARE INSTRUCTIONS   Only take over-the-counter or prescription medicines for pain, discomfort, or fever as directed by your caregiver. Do not use aspirin.   Do not touch the blisters or pick the scabs. Wash your hands often. Do not touch your eyes without washing your hands first.   Avoid close contact with other people, especially kissing, until blisters heal.   Hot, cold, or salty foods may hurt your mouth. Use a straw to drink. Eating a well-balanced diet will help healing.  SEEK MEDICAL CARE IF:   Your eye feels irritated, painful, or you feel like you have something in your eye.   You develop a fever, feel achy, or see pus instead of clear fluid in the sores. These are signs of a bacterial infection.   You get blisters on your genitals.   You develop new, unexplained symptoms.  MAKE SURE YOU:   Understand these instructions.   Will watch your condition.   Will get help right away if you are not doing well or get worse.  Document Released: 11/19/2005 Document Revised: 11/08/2011 Document Reviewed: 03/25/2008 Exit[employer] Patient Information 2012 [employer]. 

## 2012-04-16 ENCOUNTER — Telehealth: Payer: Self-pay

## 2012-04-16 NOTE — Telephone Encounter (Signed)
Vonna from [facility] called requesting verbal order for oxygen saturation levels as far back as December 2012.  The patient is not in [facility] and Epic does not show that he has been seen by [facility].  Please call Vonna at [phone] ext [phone].

## 2012-04-17 NOTE — Telephone Encounter (Signed)
Before I call can we do this?

## 2012-04-18 NOTE — Telephone Encounter (Signed)
If patient has not been seen in epic, and is not in [facility] do we have anything on this patient? If so, we need ok from the patient.

## 2012-04-19 NOTE — Telephone Encounter (Signed)
Nothing in our system at all.  Looks like patient sees Dr. Shelle Iron at [place].  Called Bevelyn Buckles and [facility] notifying her this.

## 2012-05-02 ENCOUNTER — Telehealth: Payer: Self-pay | Admitting: Pulmonary Disease

## 2012-05-02 MED ORDER — ALBUTEROL SULFATE HFA 108 (90 BASE) MCG/ACT IN AERS: 2.0000 | INHALATION_SPRAY | Freq: Four times a day (QID) | RESPIRATORY_TRACT | Status: DC | PRN

## 2012-05-02 NOTE — Telephone Encounter (Signed)
atrovent or albuterol can be considered for rescue.  Some pt's respond better to one than another.  However, if someone is using rescue inhaler everyday, that tells me they are having trouble, and may need to be seen.

## 2012-05-02 NOTE — Telephone Encounter (Signed)
Called and spoke with pt and he is aware of albuterol HFA sent to the pharmacy.  Pt is aware that if he is using the albuterol that he is not to use the atrovent HFA.  Pt voiced his understanding of this and nothing further was needed.

## 2012-05-02 NOTE — Telephone Encounter (Signed)
Called and spoke with pt and he stated that the pulmonary rehab at [facility] told the pt that the atrovent inhaler was the same as the spiriva and rec that the pt call and get a rescue inhaler sent to the pharmacy.  Pt stated that he was seen at [facility] for cough and they gave him a ventolin hfa but this does not work too well. KC please advise if ok to send in albuterol inhaler for the pt.  Thanks  Allergies  Allergen Reactions  . Amitriptyline Other (See Comments)    Blacked out Went crazy-did not know what was going on.  . Celecoxib Rash

## 2012-05-24 ENCOUNTER — Other Ambulatory Visit: Payer: Self-pay | Admitting: Pulmonary Disease

## 2012-06-23 ENCOUNTER — Other Ambulatory Visit: Payer: Self-pay | Admitting: Pulmonary Disease

## 2012-06-23 NOTE — Telephone Encounter (Signed)
Pt called.  He needs refills for Proair, Advair, and Astravent.  He uses [facility] on [address] in [place].  Call pt back @ [phone] Leanora Ivanoff

## 2012-07-17 ENCOUNTER — Telehealth: Payer: Self-pay | Admitting: Pulmonary Disease

## 2012-07-17 NOTE — Telephone Encounter (Signed)
lmomtcb x1 for pt 

## 2012-07-17 NOTE — Telephone Encounter (Signed)
Pt returned triage's call.  Holly D Pryor ° °

## 2012-07-17 NOTE — Telephone Encounter (Signed)
Pt is requesting a refill on his Atrovent HFA inhaler. I no longer see this on the pt's current medication list. Pt says he is using the Proair, Advair and Spiriva and at one time he had the Atrovent as well and thought this worked well for him. He denies any changes in his breathing. Patient is scheduled for follow-up on 09/04/12. KC, pls advise.

## 2012-07-18 NOTE — Telephone Encounter (Signed)
Let him know that we do not use 2 different rescue inhalers (ie albuterol and atrovent).  If he feels the atrovent works better for rescue (not using on a regular basis), would stop albuterol and just use atrovent.  He should also know that atrovent is the short acting version of spiriva, and spiriva is a 24hr medication.  I do have some pt's who are on both, but not many.  Ok with me to use atrovent for rescue, but can't stay on albuterol if he does.

## 2012-07-18 NOTE — Telephone Encounter (Signed)
I spoke with pt and advised him of KC's response. He stated that he would just stay on his proair instead of changing over to atrovent. He did not need anything further

## 2012-08-08 ENCOUNTER — Telehealth: Payer: Self-pay | Admitting: Pulmonary Disease

## 2012-08-08 MED ORDER — TIOTROPIUM BROMIDE MONOHYDRATE 18 MCG IN CAPS: 18.0000 ug | ORAL_CAPSULE | Freq: Every day | RESPIRATORY_TRACT | Status: DC

## 2012-08-08 NOTE — Telephone Encounter (Signed)
rx has been sent in to the pharmacy for spiriva per pts request.  Called and spoke with pt and nothing further is needed.

## 2012-09-04 ENCOUNTER — Ambulatory Visit: Payer: Medicaid Other | Admitting: Pulmonary Disease

## 2012-09-23 ENCOUNTER — Ambulatory Visit: Payer: Medicaid Other | Admitting: Pulmonary Disease

## 2012-09-24 ENCOUNTER — Telehealth: Payer: Self-pay | Admitting: Pulmonary Disease

## 2012-09-24 DIAGNOSIS — J449 Chronic obstructive pulmonary disease, unspecified: Secondary | ICD-10-CM

## 2012-09-24 NOTE — Telephone Encounter (Signed)
Pt requests referral be placed for this group. Order placed. Carron Curie, CMA

## 2012-09-24 NOTE — Telephone Encounter (Signed)
I spoke with pt and he stated he would like a referral to a pulmonologist in [place]. He state it is hard for him to find a ride to [place] for his appts. Please advise KC thanks

## 2012-09-24 NOTE — Telephone Encounter (Signed)
There is only one practice called [facility] They are all very good.

## 2012-10-07 ENCOUNTER — Telehealth: Payer: Self-pay | Admitting: Pulmonary Disease

## 2012-10-07 MED ORDER — TIOTROPIUM BROMIDE MONOHYDRATE 18 MCG IN CAPS: 18.0000 ug | ORAL_CAPSULE | Freq: Every day | RESPIRATORY_TRACT | Status: DC

## 2012-10-07 MED ORDER — ALBUTEROL SULFATE HFA 108 (90 BASE) MCG/ACT IN AERS: 2.0000 | INHALATION_SPRAY | RESPIRATORY_TRACT | Status: DC | PRN

## 2012-10-07 MED ORDER — FLUTICASONE-SALMETEROL 250-50 MCG/DOSE IN AEPB: INHALATION_SPRAY | RESPIRATORY_TRACT | Status: DC

## 2012-10-07 NOTE — Telephone Encounter (Signed)
Enough refills sent to last until upcoming appt on 10/22/12. Pt is aware.  Carron Curie, CMA

## 2012-10-22 ENCOUNTER — Ambulatory Visit: Payer: Medicaid Other | Admitting: Pulmonary Disease

## 2012-10-31 ENCOUNTER — Ambulatory Visit: Payer: Medicaid Other | Admitting: Pulmonary Disease

## 2012-11-03 ENCOUNTER — Telehealth: Payer: Self-pay | Admitting: Pulmonary Disease

## 2012-11-03 MED ORDER — FLUTICASONE-SALMETEROL 250-50 MCG/DOSE IN AEPB: INHALATION_SPRAY | RESPIRATORY_TRACT | Status: DC

## 2012-11-03 NOTE — Telephone Encounter (Signed)
Sent in a refill for him. He has an appointment coming up on 11/05/12.

## 2012-11-05 ENCOUNTER — Ambulatory Visit: Payer: Medicaid Other | Admitting: Pulmonary Disease

## 2012-11-14 ENCOUNTER — Ambulatory Visit: Payer: Medicaid Other | Admitting: Pulmonary Disease

## 2012-11-20 ENCOUNTER — Ambulatory Visit: Payer: Medicaid Other | Admitting: Pulmonary Disease

## 2012-12-02 ENCOUNTER — Telehealth: Payer: Self-pay | Admitting: Pulmonary Disease

## 2012-12-02 MED ORDER — FLUTICASONE-SALMETEROL 250-50 MCG/DOSE IN AEPB: INHALATION_SPRAY | RESPIRATORY_TRACT | Status: DC

## 2012-12-02 MED ORDER — ALBUTEROL SULFATE HFA 108 (90 BASE) MCG/ACT IN AERS: 2.0000 | INHALATION_SPRAY | RESPIRATORY_TRACT | Status: DC | PRN

## 2012-12-02 NOTE — Telephone Encounter (Signed)
Rx sent into pharmacy: patient needs to keep OV for any further refills

## 2012-12-05 ENCOUNTER — Ambulatory Visit: Payer: Medicaid Other | Admitting: Pulmonary Disease

## 2012-12-18 ENCOUNTER — Ambulatory Visit: Payer: Medicaid Other | Admitting: Pulmonary Disease

## 2012-12-19 ENCOUNTER — Ambulatory Visit (INDEPENDENT_AMBULATORY_CARE_PROVIDER_SITE_OTHER): Payer: Medicaid Other | Admitting: Pulmonary Disease

## 2012-12-19 ENCOUNTER — Encounter: Payer: Self-pay | Admitting: Pulmonary Disease

## 2012-12-19 VITALS — BP 120/80 | HR 108 | Temp 98.7°F | Ht 68.0 in | Wt 195.0 lb

## 2012-12-19 DIAGNOSIS — J449 Chronic obstructive pulmonary disease, unspecified: Secondary | ICD-10-CM

## 2012-12-19 NOTE — Progress Notes (Signed)
  Subjective:    Patient ID: Brent Oneal, male    DOB: July 24, 1966, 47 y.o.   MRN: [id number]  HPI The patient comes in today for followup of his known severe COPD with chronic respiratory failure.  He is staying on his bronchodilator regimen, but is unsure if Spiriva is doing anything for him.  He feels that his breathing lately has been worse than his normal baseline, but he has not had any pulmonary infections or acute exacerbations.  Unfortunately, he continues to smoke, quit participating in pulmonary rehabilitation.  I have stressed to him the importance of weight loss and a conditioning program, as well as total smoking cessation.   Review of Systems  Constitutional: Negative for fever and unexpected weight change.  HENT: Positive for congestion ( at night time). Negative for ear pain, nosebleeds, sore throat, rhinorrhea, sneezing, trouble swallowing, dental problem, postnasal drip and sinus pressure.   Eyes: Negative for redness and itching.  Respiratory: Positive for chest tightness ( at times and comes with Anxiety attacks), shortness of breath and wheezing. Negative for cough.   Cardiovascular: Negative for palpitations and leg swelling.  Gastrointestinal: Negative for nausea and vomiting.  Genitourinary: Negative for dysuria.  Musculoskeletal: Negative for joint swelling.  Skin: Negative for rash.  Neurological: Negative for headaches.  Hematological: Does not bruise/bleed easily.  Psychiatric/Behavioral: Negative for dysphoric mood. The patient is nervous/anxious.        Objective:   Physical Exam Overweight male in no acute distress Nose without purulence or discharge noted Neck without lymphadenopathy or thyromegaly Chest with decreased breath sounds, no wheezing Cardiac exam regular rate and rhythm Lower extremities with mild ankle edema, no cyanosis Alert and oriented, moves all 4 extremities.       Assessment & Plan:

## 2012-12-19 NOTE — Patient Instructions (Addendum)
Stay on advair everyday, along with albuterol as needed for rescue Will try tudorza one inhalation am and pm in the place of spiriva for one month.  Let me know which of the 2 helps you the most Try and stop smoking 100% Work on weight loss and some type of exercise program. followup with me in 6mos if you are doing ok.

## 2012-12-19 NOTE — Assessment & Plan Note (Signed)
The patient appears to be stable overall from a pulmonary standpoint, but unfortunately he is continuing to smoke and not participating in any type of conditioning program.  He is not convinced that Spiriva is helping him, and therefore I would like to give him a short trial of tudorza.  He will let me in if this works better for him.  In the meantime, I've asked him to totally stop smoking, and to work on some kind of exercise and conditioning program.

## 2013-01-08 ENCOUNTER — Other Ambulatory Visit: Payer: Self-pay | Admitting: Pulmonary Disease

## 2013-01-08 ENCOUNTER — Telehealth: Payer: Self-pay | Admitting: Pulmonary Disease

## 2013-01-08 ENCOUNTER — Other Ambulatory Visit: Payer: Self-pay | Admitting: *Deleted

## 2013-01-08 MED ORDER — FLUTICASONE-SALMETEROL 250-50 MCG/DOSE IN AEPB: INHALATION_SPRAY | RESPIRATORY_TRACT | Status: DC

## 2013-01-08 MED ORDER — ALBUTEROL SULFATE HFA 108 (90 BASE) MCG/ACT IN AERS: 2.0000 | INHALATION_SPRAY | RESPIRATORY_TRACT | Status: DC | PRN

## 2013-01-08 MED ORDER — ACLIDINIUM BROMIDE 400 MCG/ACT IN AEPB: 1.0000 | INHALATION_SPRAY | Freq: Two times a day (BID) | RESPIRATORY_TRACT | Status: DC

## 2013-01-08 NOTE — Telephone Encounter (Signed)
Rx's have been sent in, pt is aware. 

## 2013-04-10 ENCOUNTER — Telehealth: Payer: Self-pay | Admitting: Pulmonary Disease

## 2013-04-13 NOTE — Telephone Encounter (Signed)
Noted by triage Thanks! Will forward back to [facility] nurse per triage protocol

## 2013-04-13 NOTE — Telephone Encounter (Signed)
PA was initiated on Friday with the insurance company

## 2013-04-13 NOTE — Telephone Encounter (Signed)
Spoke with [email or web address] , they never received the PA that Initiated online--apparantly did not go through.  This has been resent to plan today 3:20p. For reference in look-up [email or web address]: ([place]: [id number])

## 2013-04-13 NOTE — Telephone Encounter (Signed)
Brent Oneal, have you received any info from the pharmacy on this PA?  Will happy to call for you if not.  Thanks!

## 2013-04-14 NOTE — Telephone Encounter (Signed)
Pt is requesting a status update & can be reached at [phone].  Antionette Fairy

## 2013-04-14 NOTE — Telephone Encounter (Signed)
Spoke with patient, informed him that PA had to be resent to covermymeds. We will let patient know as soon as possible about status Will forward to Ashtyn to follow up with, thanks!

## 2013-04-15 NOTE — Telephone Encounter (Signed)
Called [facility] per Ashtyn's request @[phone] Spoke with Tresa Endo and answered clinical questions over the phone >> what meds has pt tried/failed (spiriva, symbicort and currently taking adviar in addition to the [place]), qty prescribed (1 inhaler per 30 days) and prescribed refills (5). Case has been sent to review per [facility] with Case # [id number] P Per Tresa Endo, a decision will be made in the next 24 hours Will forward back to Ashtyn to make her aware this is pending decision

## 2013-04-20 MED ORDER — ACLIDINIUM BROMIDE 400 MCG/ACT IN AEPB: 1.0000 | INHALATION_SPRAY | Freq: Two times a day (BID) | RESPIRATORY_TRACT | Status: DC

## 2013-04-20 NOTE — Telephone Encounter (Signed)
Brent Oneal has been approved per insurance.  Approved 04/15/13-10/12/13 Pt aware, Rx sent to pharm.

## 2013-04-24 ENCOUNTER — Telehealth: Payer: Self-pay | Admitting: Pulmonary Disease

## 2013-04-24 NOTE — Telephone Encounter (Signed)
Called spoke with patient and discussed with him KC's recs as stated below.  Pt has decided that he will finish this current inhaler and see what he thinks/how he feels once completed.  Pt will call the office back if he decides to change inhalers.    Will sign off and forward back to [facility] as FYI.

## 2013-04-24 NOTE — Telephone Encounter (Signed)
Patient returning call.

## 2013-04-24 NOTE — Telephone Encounter (Signed)
Those inhalers have been extensively test for dose to dose reliability.  I suspect it is working ok, but if he wishes to try something different we can.

## 2013-04-24 NOTE — Telephone Encounter (Signed)
I spoke with pt and he stated he has advair diskus. He states sometimes it will give him the correct amount of medication and sometimes it won't. He states he is using this correctly but feels its the inhaler. He states this has been happening for a few months now. Please advise Dr. Shelle Iron thanks  Allergies  Allergen Reactions  . Amitriptyline Other (See Comments)    Blacked out Went crazy-did not know what was going on.  . Celecoxib Rash

## 2013-04-24 NOTE — Telephone Encounter (Signed)
LMTCbx1. Reshard Guillet, CMA   

## 2013-05-08 ENCOUNTER — Telehealth: Payer: Self-pay | Admitting: Pulmonary Disease

## 2013-05-08 NOTE — Telephone Encounter (Signed)
lmomtcb  

## 2013-05-08 NOTE — Telephone Encounter (Signed)
Usually if someone sucks something into their lungs, they have severe cough that lasts a long time. If he is not having this, he still should probably have a chest xray sometime next week.  Can come by and get.  Agree with going to ER if develops symptoms.

## 2013-05-08 NOTE — Telephone Encounter (Signed)
I spoke with pt. He states he is fine now. He stated he was sucking on a jaw breaker and swallowed it. He stated he is not sure if had went into his "lungs" or "stomach". He was experiencing SOB and dizziness. He stated now those symptoms are gone and he appears to be fine. I advised him if those symptoms come back and it being the weekend then he needed to seek emergency care. He voiced his understanding. Will forward to [facility] as an FYI/

## 2013-05-08 NOTE — Telephone Encounter (Signed)
lmomtcb to make him aware of KC recs.

## 2013-05-11 NOTE — Telephone Encounter (Signed)
Pt states he is feeling much better and wants to hold off on getting xray at this time. Carron Curie, CMA

## 2013-06-19 ENCOUNTER — Ambulatory Visit: Payer: Medicaid Other | Admitting: Pulmonary Disease

## 2013-06-27 ENCOUNTER — Other Ambulatory Visit: Payer: Self-pay | Admitting: Pulmonary Disease

## 2013-06-27 NOTE — Telephone Encounter (Signed)
Advair reordered.

## 2013-07-07 ENCOUNTER — Telehealth: Payer: Self-pay | Admitting: Pulmonary Disease

## 2013-07-07 ENCOUNTER — Other Ambulatory Visit: Payer: Self-pay | Admitting: Pulmonary Disease

## 2013-07-07 MED ORDER — ALBUTEROL SULFATE HFA 108 (90 BASE) MCG/ACT IN AERS: 2.0000 | INHALATION_SPRAY | RESPIRATORY_TRACT | Status: DC | PRN

## 2013-07-07 NOTE — Telephone Encounter (Signed)
Spoke with patient-- Patient requesting refill on Pro [facility] [address] [place] [facility] Medication sent, patient aware and nothing further needed at this time

## 2013-07-22 ENCOUNTER — Telehealth: Payer: Self-pay | Admitting: Pulmonary Disease

## 2013-07-22 NOTE — Telephone Encounter (Signed)
I spoke with [name]. She stated Brent Oneal requested to have transportation to our office. She understanding this is for continuity of care. They operate outside of [employer] and Brent One[place]. She stated she received the form from [place] and all it stated was Brent Oneal needed to see a specialists. Per [name] per state guidelines they need more explanation bc forysth has specialists in that county. It needs to have specifics to why Brent Oneal needs to come here. Brent Oneal has already explained to them how long he has been seeing KC but they are needing the medical explanation from [facility]. She is refaxing form over to triage. Will await fax

## 2013-07-24 NOTE — Telephone Encounter (Signed)
I spoke with Brent Oneal. She is refaxing this to [phone]. Will forward to ashytn to look out for this.

## 2013-07-24 NOTE — Telephone Encounter (Signed)
No I do not have this form nor have I seen it come across my desk.

## 2013-07-24 NOTE — Telephone Encounter (Signed)
I still do not see this in triage. Ashtynn did you happen to receive any paperwork on this patient? If not I will call again. Thanks. Carron Curie, CMA

## 2013-07-27 NOTE — Telephone Encounter (Signed)
done

## 2013-07-27 NOTE — Telephone Encounter (Signed)
Form has been received and placed in KC Kijana Cromie folder to review and sign.  Please advise Dr Shelle Iron, Thanks.

## 2013-07-28 NOTE — Telephone Encounter (Signed)
Form completed and faxed back to [employer] [place] @ [phone]

## 2013-09-22 ENCOUNTER — Ambulatory Visit: Payer: Medicaid Other | Admitting: Pulmonary Disease

## 2013-10-06 ENCOUNTER — Telehealth: Payer: Self-pay | Admitting: Pulmonary Disease

## 2013-10-06 MED ORDER — NYSTATIN 100000 UNIT/GM EX POWD: CUTANEOUS | Status: DC

## 2013-10-06 NOTE — Telephone Encounter (Signed)
[facility] lmomtcb to inform pt of below.

## 2013-10-06 NOTE — Telephone Encounter (Signed)
Ok with me as you have described.  No refills.  Future meds will need to come from primary md.

## 2013-10-06 NOTE — Telephone Encounter (Addendum)
Called, spoke with pt -  Pt states he was seen at [facility] last month for breathing problems and "gaulding" in groin area.  Pt reports he was given "Nystop" Powder for yeast in his groin area.  Pt states he is still having problems with "gualding" and rawness in his groin area and is requesting KC refill this for him.  Pt states he doesn't have a PCP at this time.  He is now living in [place] and is trying to get in with the [facility].  He has a pending appt with them next month, but right now only has KC as a dr.  Algis Downs I could not guarantee KC will do this, but would ask per his request.  He verbalized understanding.  KC, pls advise.  Thank you.    [facility] [address] [facility] [place] [facility]  [facility] is [facility].  Spoke with Chrissy.  Was advised pt is requesting nystatin powder with directions of bid until rash clears.  Chrissy states if [facility] approves this, she recs pt be given 60 gms.

## 2013-10-07 NOTE — Telephone Encounter (Signed)
Pt advised. Liala Codispoti, CMA  

## 2013-10-26 ENCOUNTER — Ambulatory Visit (INDEPENDENT_AMBULATORY_CARE_PROVIDER_SITE_OTHER): Payer: Medicaid Other | Admitting: Pulmonary Disease

## 2013-10-26 ENCOUNTER — Encounter (INDEPENDENT_AMBULATORY_CARE_PROVIDER_SITE_OTHER): Payer: Self-pay

## 2013-10-26 ENCOUNTER — Encounter: Payer: Self-pay | Admitting: Pulmonary Disease

## 2013-10-26 VITALS — BP 132/92 | HR 94 | Temp 98.2°F | Ht 67.0 in | Wt 194.0 lb

## 2013-10-26 DIAGNOSIS — J449 Chronic obstructive pulmonary disease, unspecified: Secondary | ICD-10-CM

## 2013-10-26 DIAGNOSIS — J9610 Chronic respiratory failure, unspecified whether with hypoxia or hypercapnia: Secondary | ICD-10-CM

## 2013-10-26 NOTE — Assessment & Plan Note (Signed)
The patient continues to have significant dyspnea on exertion, but it least he is not having recurrent exacerbations. He continues to smoke, and I have told him that he will never improve if he continues to do so.   I will try him on breo in the place of Advair, and see how he does. I have also asked him to stop smoking 100%, and to work on some kind of exercise program.

## 2013-10-26 NOTE — Progress Notes (Signed)
  Subjective:    Patient ID: Brent Oneal, male    DOB: 04-01-1966, 47 y.o.   MRN: [id number]  HPI The patient comes in today for followup of his known COPD with chronic respiratory failure. He has done better on tudorza, but unfortunately is only taking it once a day. He is also complaining that his Advair inhalers are not working properly. Unfortunately, he continues to smoke, and feels that his exertional tolerance is poor. He currently denies chest congestion or purulent mucus.   Review of Systems  Constitutional: Negative for fever and unexpected weight change.  HENT: Negative for congestion, dental problem, ear pain, nosebleeds, postnasal drip, rhinorrhea, sinus pressure, sneezing, sore throat and trouble swallowing.   Eyes: Negative for redness and itching.  Respiratory: Positive for cough, shortness of breath and wheezing. Negative for chest tightness.   Cardiovascular: Negative for palpitations and leg swelling.  Gastrointestinal: Negative for nausea and vomiting.  Genitourinary: Negative for dysuria.  Musculoskeletal: Negative for joint swelling.  Skin: Negative for rash.  Neurological: Negative for headaches.  Hematological: Does not bruise/bleed easily.  Psychiatric/Behavioral: Negative for dysphoric mood. The patient is not nervous/anxious.        Objective:   Physical Exam Overweight male in no acute distress Nose without purulence or discharge noted Neck without lymphadenopathy or thyromegaly Chest with decreased breath sounds, a few rhonchi, no wheezing Cardiac exam with regular rate and rhythm Lower extremities with minimal edema, no cyanosis Alert and oriented, moves all 4 extremities.       Assessment & Plan:

## 2013-10-26 NOTE — Patient Instructions (Signed)
Make sure you take tudorza one inhalation TWICE a day Will try breo in the place of advair.  Take one inhalation once each am. Work on total smoking cessation. Will give you a flu shot today. followup with me in 6mos.

## 2013-11-11 ENCOUNTER — Encounter: Payer: Self-pay | Admitting: *Deleted

## 2013-11-12 ENCOUNTER — Telehealth: Payer: Self-pay | Admitting: Pulmonary Disease

## 2013-11-12 NOTE — Telephone Encounter (Signed)
Spoke to pt. He is needing refills on Breo inhaler. States that he has been taking Breo BID. Advised him that Virgel Bouquet is to be taken once a day not BID. He feels that taking it once a day is not enough so he's been doing it BID. Currently using Tudorza BID as well. Pt has not stopped smoking. I let him know that I could not send in the rx for BID without speaking to [facility] first. He agreed and verbalized understanding.  KC - please advise.

## 2013-11-13 MED ORDER — FLUTICASONE FUROATE-VILANTEROL 100-25 MCG/INH IN AEPB: 1.0000 | INHALATION_SPRAY | Freq: Every morning | RESPIRATORY_TRACT | Status: DC

## 2013-11-13 NOTE — Telephone Encounter (Signed)
Pt returned call Spoke with patient and advised that [facility] will not approve more than 30 doses per month of his Breo.  Pt okay with this and verbalized his understanding.  [facility] per pt's request. Med list updated. Nothing further needed; will sign off.

## 2013-11-13 NOTE — Telephone Encounter (Signed)
LMTC x 1  

## 2013-11-13 NOTE — Telephone Encounter (Signed)
Cannot have but 30 doses of breo a month.

## 2013-11-13 NOTE — Telephone Encounter (Signed)
Pt is asking to speak w/ KC.  Pt would also like to discuss Rx from yesterday & can be reached [phone].  Antionette Fairy

## 2013-11-20 ENCOUNTER — Telehealth: Payer: Self-pay | Admitting: Pulmonary Disease

## 2013-11-20 NOTE — Telephone Encounter (Signed)
Left message on home and cell phone to call back office

## 2013-11-23 NOTE — Telephone Encounter (Signed)
Spoke with pt. States that he will stay on Breo and give it more time. I will contact the pt's pharmacy to get the PA form faxed.

## 2013-11-23 NOTE — Telephone Encounter (Signed)
Let him know that breo is not causing his GI issues We could try him on symbicort or dulera.  He has tried symbicort in the past and felt it did not work for him.  He tried advair and thinks the device frequently doesn't work properly.   We can stick with breo and give more time, or can try dulera 100/5  2 puffs am and pm

## 2013-11-23 NOTE — Telephone Encounter (Signed)
Called, spoke with pt.  Reports we will need to go through Medicaid to get [facility] approved.  However, pt reports he believes the [facility] caused him to have loose stools.  Reports when he started this medication, he noticed he was having this.  He hadn't changed anything else.  Pt would like to know if there is another medication he can try in place of Breo before we try to approve this through Medicaid.  Reports he finished the samples given to him x 1-2 wks ago.  Dr. Shelle Iron, pls advise if there is another medication pt can try in place of the [facility].  Thank you.

## 2013-11-24 ENCOUNTER — Telehealth: Payer: Self-pay | Admitting: Pulmonary Disease

## 2013-11-24 NOTE — Telephone Encounter (Signed)
qvar is a TOTALLY different drug from advair and symbicort.  They are not equivalent.

## 2013-11-24 NOTE — Telephone Encounter (Signed)
PA form is in your Cameshia Cressman folder. Please specify on form what medication patient needs to be given as alternative. Nurse will fax once complete. Thanks.

## 2013-11-24 NOTE — Telephone Encounter (Signed)
[facility] [address] [facility] [place] [facility]  [place] is requiring prior authorization [place] [facility] [phone] Patient ID # [id number] M  Called [facility] at the number above and spoke with representative Johnny Bridge Per Johnny Bridge, pt must try and fail 2 preferred drugs >> Advair, Symbicort and Dulera Pt has tried and failed both Advair and Symbicort but [place] [facility] also specifically asks about QVAR  Form printed from the [employer] w/ Martha's assistance over the phone Form filled out to the best of my ability and placed in [facility] lookat Message forwarded to [facility] and Ashtyn to follow per protocol

## 2013-12-11 ENCOUNTER — Other Ambulatory Visit: Payer: Self-pay | Admitting: *Deleted

## 2013-12-11 MED ORDER — FLUTICASONE FUROATE-VILANTEROL 100-25 MCG/INH IN AEPB: 1.0000 | INHALATION_SPRAY | Freq: Every day | RESPIRATORY_TRACT | Status: DC

## 2013-12-11 NOTE — Telephone Encounter (Signed)
PA initiated for pt for Brent BrunswickBreo Confirm # [id number] P Approved through 12/11/2013-10/06/2014  Rx sent to pharmacy

## 2013-12-21 ENCOUNTER — Telehealth: Payer: Self-pay | Admitting: Internal Medicine

## 2013-12-21 NOTE — Telephone Encounter (Signed)
Spoke with the pt  He states ever since starting Moody AFBBreo he has had diarrhea  He stopped taking for approx 1 wk bc he ran out of med and diarrhea went away It has returned since starting back on med Please advise recs thanks

## 2013-12-21 NOTE — Telephone Encounter (Signed)
First he felt advair was not working, now he is having diarrhea with breo.  Only thing we can do is to try on symbicort 160/4.5  2 bid or dulera 100/5  2 bid.  Let him choose and send in prescription.

## 2013-12-21 NOTE — Telephone Encounter (Signed)
LMTCB

## 2013-12-22 ENCOUNTER — Telehealth: Payer: Self-pay | Admitting: Pulmonary Disease

## 2013-12-22 MED ORDER — BUDESONIDE-FORMOTEROL FUMARATE 160-4.5 MCG/ACT IN AERO: 2.0000 | INHALATION_SPRAY | Freq: Two times a day (BID) | RESPIRATORY_TRACT | Status: DC

## 2013-12-22 NOTE — Telephone Encounter (Signed)
Pt advised and symbicort sent. Brent Oneal, CMA

## 2013-12-22 NOTE — Telephone Encounter (Signed)
Spoke with Mellody DanceKeith. He was calling to clarify pt to get symbicort. I advised this is correct. Nothing further needed

## 2014-01-04 ENCOUNTER — Telehealth: Payer: Self-pay | Admitting: *Deleted

## 2014-01-04 NOTE — Telephone Encounter (Signed)
Received [employer] Medical [employer] Certificate. We have completed our part and Dr Shelle Ironlance has signed the form. Forms is needing the patients signature in order to be completed and mailed back to [employer].  LM to contact our office in regards to form.

## 2014-01-09 ENCOUNTER — Other Ambulatory Visit: Payer: Self-pay | Admitting: Pulmonary Disease

## 2014-01-11 ENCOUNTER — Telehealth: Payer: Self-pay | Admitting: Pulmonary Disease

## 2014-01-11 MED ORDER — ACLIDINIUM BROMIDE 400 MCG/ACT IN AEPB: 1.0000 | INHALATION_SPRAY | Freq: Two times a day (BID) | RESPIRATORY_TRACT | Status: DC

## 2014-01-11 NOTE — Telephone Encounter (Signed)
Let him know that sudden swelling can be anything.  It can be a kidney problem, heart problem, blood clots in legs, etc.   He really needs to see his primary doctor to get this looked at and various testing done.

## 2014-01-11 NOTE — Telephone Encounter (Signed)
Advised pt of Brent Oneal's rec's and he will make appointment with PCP for tomorrow and nothing further is needed

## 2014-01-11 NOTE — Telephone Encounter (Signed)
Spoke with pt-- Updated address information in EPIC Pt unable to drive--cannot make it up here to sign form Form mailed to pts home to sign and mail to [employer] Nothing further needed.

## 2014-01-11 NOTE — Telephone Encounter (Signed)
Pt states he is swelling in ankles, all the way up the leg and getting into the knees. SOB comes and goes. Pt would like why this is happening and what should he do. [facility] Please advise.

## 2014-01-11 NOTE — Telephone Encounter (Signed)
I spoke with the pt and he states the [place] is going to need a PA. I advised the pt on the process of this and that it would take a few days for a response. I advised I will call the pharmacy and get the contact info for insurance. Contact # is [phone]. I called to initiate PA. Medication approved x 1 year. Pt is aware. Carron CurieJennifer Castillo, CMA

## 2014-01-11 NOTE — Telephone Encounter (Signed)
[phone].  Pt is at the pharmacy stating that his insurance is refusing to cover the cost of the medication & needs this taken care of w/in the next 20 minutes.  Antionette FairyHolly D Pryor

## 2014-01-11 NOTE — Telephone Encounter (Signed)
Refill sent and pt is aware. Jennifer Castillo, CMA  

## 2014-01-13 ENCOUNTER — Telehealth: Payer: Self-pay | Admitting: Pulmonary Disease

## 2014-01-13 NOTE — Telephone Encounter (Signed)
Called and spoke with pt. He was confirmed her is doing 2 puffs BID. I advised this was correct. Nothing further needed

## 2014-01-20 ENCOUNTER — Telehealth: Payer: Self-pay | Admitting: Pulmonary Disease

## 2014-01-20 NOTE — Telephone Encounter (Signed)
Pt reports flu vaccine was not documented and pt wanted it put in chart. I have updated that this was given at last OV, nothing further is needed

## 2014-03-24 ENCOUNTER — Other Ambulatory Visit: Payer: Self-pay | Admitting: Pulmonary Disease

## 2014-04-28 ENCOUNTER — Ambulatory Visit: Payer: Medicaid Other | Admitting: Pulmonary Disease

## 2014-04-28 ENCOUNTER — Other Ambulatory Visit: Payer: Self-pay | Admitting: Pulmonary Disease

## 2014-05-01 ENCOUNTER — Other Ambulatory Visit: Payer: Self-pay | Admitting: Pulmonary Disease

## 2014-05-20 ENCOUNTER — Ambulatory Visit (INDEPENDENT_AMBULATORY_CARE_PROVIDER_SITE_OTHER): Payer: Medicaid Other | Admitting: Pulmonary Disease

## 2014-05-20 ENCOUNTER — Encounter: Payer: Self-pay | Admitting: Pulmonary Disease

## 2014-05-20 VITALS — BP 130/82 | HR 93 | Temp 98.1°F | Ht 67.0 in | Wt 184.4 lb

## 2014-05-20 DIAGNOSIS — R0902 Hypoxemia: Secondary | ICD-10-CM

## 2014-05-20 DIAGNOSIS — J9610 Chronic respiratory failure, unspecified whether with hypoxia or hypercapnia: Secondary | ICD-10-CM

## 2014-05-20 DIAGNOSIS — J439 Emphysema, unspecified: Secondary | ICD-10-CM

## 2014-05-20 DIAGNOSIS — J438 Other emphysema: Secondary | ICD-10-CM

## 2014-05-20 DIAGNOSIS — J9611 Chronic respiratory failure with hypoxia: Secondary | ICD-10-CM

## 2014-05-20 NOTE — Progress Notes (Signed)
   Subjective:    Patient ID: Brent Oneal, male    DOB: 08/11/1966, 48 y.o.   MRN: [id number]  HPI The patient comes in today for followup of his known COPD with chronic respiratory failure. Unfortunately, he continues to smoke, but it least has not had any recent acute exacerbations. He is asking about a different medication other than Symbicort, and I've discussed with him all the options. He denies any significant cough, purulence, or congestion.   Review of Systems  Constitutional: Negative for fever and unexpected weight change.  HENT: Negative for congestion, dental problem, ear pain, nosebleeds, postnasal drip, rhinorrhea, sinus pressure, sneezing, sore throat and trouble swallowing.   Eyes: Negative for redness and itching.  Respiratory: Positive for cough and shortness of breath. Negative for chest tightness and wheezing.   Cardiovascular: Negative for palpitations and leg swelling.  Gastrointestinal: Negative for nausea and vomiting.  Genitourinary: Negative for dysuria.  Musculoskeletal: Negative for joint swelling.  Skin: Negative for rash.  Neurological: Negative for headaches.  Hematological: Does not bruise/bleed easily.  Psychiatric/Behavioral: Negative for dysphoric mood. The patient is not nervous/anxious.        Objective:   Physical Exam Overweight male in no acute distress Nose without purulence or discharge noted Neck without lymphadenopathy or thyromegaly Chest with decreased breath sounds, no active wheezing Cardiac exam with regular rate and rhythm Lower extremities with 1+ edema, no cyanosis Alert and oriented, moves all 4 extremities appear       Assessment & Plan:

## 2014-05-20 NOTE — Patient Instructions (Signed)
Will try dulera 100/5  2 inhalations am and pm in the place of symbicort.  Let us know if this works better for you, and we can send in prescription Think about changing over to nebulized medications Stop smoking.  This is the most important thing followup with me again in 6mos.

## 2014-05-20 NOTE — Assessment & Plan Note (Signed)
The patient feels that he is near his usual baseline, and has not had an acute exacerbation since last visit. He does not believe Symbicort is helping him that much, and I have offered to change him to dulera or nebulized medications. Part of the problem is that he continues to smoke, and I've explained there are no medications that are going to make him better if he continues to smoke. I have asked him to stay on his oxygen, work on some type of conditioning and weight loss program, and give us feedback with dulera. I would have a low threshold to changing him to performist and budesonide

## 2014-05-26 ENCOUNTER — Telehealth: Payer: Self-pay | Admitting: Pulmonary Disease

## 2014-05-26 ENCOUNTER — Other Ambulatory Visit: Payer: Self-pay | Admitting: Pulmonary Disease

## 2014-05-26 MED ORDER — ACLIDINIUM BROMIDE 400 MCG/ACT IN AEPB: 1.0000 | INHALATION_SPRAY | Freq: Two times a day (BID) | RESPIRATORY_TRACT | Status: DC

## 2014-05-26 NOTE — Telephone Encounter (Signed)
He needs ov for evaluation

## 2014-05-26 NOTE — Telephone Encounter (Signed)
He can take mucinex DM one bid to see if helps The most important thing is to quit smoking.  His cough, mucus, and chest symptoms will never go away until he quits.adding more medications will not make a difference.  Get with Victorino DikeJennifer to see when I can see him again.

## 2014-05-26 NOTE — Telephone Encounter (Signed)
Pt is calling for a refill on predisone.Caren GriffinsStanley A Dalton

## 2014-05-26 NOTE — Telephone Encounter (Signed)
There are no openings in the office AT ALL this week with limited providers in the office Called spoke with patient who reported that [facility] and is unable to come back into the office at this time for an appt and will be difficult for him to arrange for transportation.  Dr Shelle Ironlance, is there any way that something can be recommended over the phone per pt's requests?  Otherwise, please advise where pt may be added on to your schedule.  Thank you.  *pt okay with call back tomorrow 6/25  Per 6/18 ov: Patient Instructions     Will try dulera 100/5 2 inhalations am and pm in the place of symbicort. Let us know if this works better for you, and we can send in prescription  Think about changing over to nebulized medications  Stop smoking. This is the most important thing  followup with me again in 6mos.

## 2014-05-26 NOTE — Telephone Encounter (Signed)
Spoke with the pt  He states that his Brent Oneal was denied at the pharm  I have refilled this for him  Then he asked for pred taper rx  He states "I just don't feel that well"  Pt very vague about symptoms  He states "haven't been breathing that well" Also having increased cough with minimal white sputum and c/o chest congestion  He denies any wheezing, chest tightness, CP, f/c/s  KC, please advise, thanks!

## 2014-05-27 NOTE — Telephone Encounter (Signed)
Spoke with the pt and [facility]  Pt verbalized understanding  He states that he does not want to be worked in b/c he was just here  Will try the mucinex and call back for ov if not improving

## 2014-05-27 NOTE — Telephone Encounter (Signed)
LMTCB

## 2014-07-20 ENCOUNTER — Other Ambulatory Visit: Payer: Self-pay | Admitting: Pulmonary Disease

## 2014-07-26 ENCOUNTER — Other Ambulatory Visit: Payer: Self-pay | Admitting: Pulmonary Disease

## 2014-10-04 ENCOUNTER — Telehealth: Payer: Self-pay | Admitting: Pulmonary Disease

## 2014-10-04 DIAGNOSIS — J439 Emphysema, unspecified: Secondary | ICD-10-CM

## 2014-10-04 NOTE — Telephone Encounter (Signed)
His last visit he was on 3 liters with adequate sats.

## 2014-10-04 NOTE — Telephone Encounter (Signed)
Spoke with pt, states that [facility] for [facility] Pt [facility] stating 2Liters O2 24/7 Pt states that he uses 3-4 Liters 24/7 and would like for us to make sure an order is sent so that [facility] correctly.   Please advise Dr Shelle Ironlance. Thanks.

## 2014-10-05 NOTE — Telephone Encounter (Signed)
LMTC x 1 - Inform pt that Order placed for updated oxygen [facility].

## 2014-10-06 NOTE — Telephone Encounter (Signed)
Called spoke with patient and informed him that [facility]15 Pt voiced his understanding Nothing further needed; will sign off

## 2014-10-27 ENCOUNTER — Telehealth: Payer: Self-pay | Admitting: Pulmonary Disease

## 2014-10-29 NOTE — Telephone Encounter (Signed)
No mention of tudorza at last OV. Pt symbicort was changed to dulera. Please advise Dr. Shelle Ironlance if pt is still to be on tudorza as well. thanks

## 2014-11-01 NOTE — Telephone Encounter (Signed)
I looked back in record, and it appears he had issue with PA for tudorza??  If it is covered, I would like him to stay on it.

## 2014-11-15 ENCOUNTER — Telehealth: Payer: Self-pay | Admitting: *Deleted

## 2014-11-15 NOTE — Telephone Encounter (Signed)
Error .Brent Oneal  

## 2014-11-16 ENCOUNTER — Telehealth: Payer: Self-pay | Admitting: Pulmonary Disease

## 2014-11-16 NOTE — Telephone Encounter (Signed)
Pt has OV with KC 11/18/14. Per Melissa @[facility] , they are trying to get the patient qualified for O2 through Medicaid and need qualifying sats. Per Melissa, need to make sure that it is clearly mentioned in note "why" the patient needs home O2.  This has been added to the appointment notes and [facility].  Nothing further needed.

## 2014-11-18 ENCOUNTER — Ambulatory Visit: Payer: Medicaid Other | Admitting: Pulmonary Disease

## 2014-11-19 ENCOUNTER — Ambulatory Visit: Payer: Medicaid Other | Admitting: Pulmonary Disease

## 2014-12-15 ENCOUNTER — Ambulatory Visit: Payer: Medicaid Other | Admitting: Pulmonary Disease

## 2014-12-22 ENCOUNTER — Encounter: Payer: Self-pay | Admitting: Pulmonary Disease

## 2014-12-22 ENCOUNTER — Ambulatory Visit (INDEPENDENT_AMBULATORY_CARE_PROVIDER_SITE_OTHER): Payer: Medicaid Other | Admitting: Pulmonary Disease

## 2014-12-22 VITALS — BP 140/80 | HR 69 | Temp 98.2°F | Ht 68.0 in | Wt 173.0 lb

## 2014-12-22 DIAGNOSIS — J9611 Chronic respiratory failure with hypoxia: Secondary | ICD-10-CM

## 2014-12-22 DIAGNOSIS — J441 Chronic obstructive pulmonary disease with (acute) exacerbation: Secondary | ICD-10-CM

## 2014-12-22 DIAGNOSIS — Z23 Encounter for immunization: Secondary | ICD-10-CM

## 2014-12-22 DIAGNOSIS — J438 Other emphysema: Secondary | ICD-10-CM

## 2014-12-22 MED ORDER — PREDNISONE 10 MG PO TABS: ORAL_TABLET | ORAL | Status: DC

## 2014-12-22 NOTE — Assessment & Plan Note (Signed)
The patient is having a COPD exacerbation related to an episode of acute bronchitis. He is finishing up his  Zpack, but was only given a four-day prednisone taper. He has had some improvement, but is not getting better as quickly as he would like.  I have asked him to finish up his antibiotics, and will extend his prednisone taper another 8 days. I have also stressed to him the importance of 100% smoking cessation.

## 2014-12-22 NOTE — Addendum Note (Signed)
Addended by: Tommie SamsSILVA, Alexander Aument S on: 12/22/2014 11:54 AM   Modules accepted: Orders

## 2014-12-22 NOTE — Progress Notes (Signed)
   Subjective:    Patient ID: Brent Oneal, male    DOB: [birth year], 49 y.o.   MRN: [id number]  HPI The patient comes in today for follow-up of his known COPD with chronic respiratory failure, but he is also having acute symptoms. He recently developed increasing shortness of breath, as well as cough with purulent mucus. Seen in the emergency room and given a Z-Pak and a prednisone taper that only last for 4 days. He feels that he is getting better, but is concerned about the short prednisone taper. His mucus quantity has decreased and is clearing, but he is continuing to have shortness of breath. The patient has been doing a vapor device more so than anything else, but still smokes occasional cigarettes.   Review of Systems  Constitutional: Negative for fever and unexpected weight change.  HENT: Positive for congestion. Negative for dental problem, ear pain, nosebleeds, postnasal drip, rhinorrhea, sinus pressure, sneezing, sore throat and trouble swallowing.   Eyes: Negative for redness and itching.  Respiratory: Positive for cough, chest tightness and shortness of breath. Negative for wheezing.   Cardiovascular: Negative for palpitations and leg swelling.  Gastrointestinal: Negative for nausea and vomiting.  Genitourinary: Negative for dysuria.  Musculoskeletal: Negative for joint swelling.  Skin: Negative for rash.  Neurological: Negative for headaches.  Hematological: Does not bruise/bleed easily.  Psychiatric/Behavioral: Negative for dysphoric mood. The patient is not nervous/anxious.        Objective:   Physical Exam Obese male in no acute distress Nose without purulence or discharge noted Neck without lymphadenopathy or thyromegaly Chest with rhonchi throughout, a few wheezes, no crackles Cardiac exam with regular rate and rhythm Lower extremities with mild edema, no cyanosis Alert, oriented, moves all 4 extremities.       Assessment & Plan:

## 2014-12-22 NOTE — Patient Instructions (Signed)
No change in medications Will give you a flu shot and pneumovax today. Stop smoking 100%.  This is very important. Once you finish your quick prednisone taper from the ER, start on another 8 day taper from me. followup with me in 6mos.

## 2014-12-22 NOTE — Assessment & Plan Note (Signed)
The patient obviously still qualifies for oxygen with a saturation of 86% today, and we will send an order to his home care company for this. He tells me that he is wearing this compliantly.

## 2015-01-12 ENCOUNTER — Telehealth: Payer: Self-pay | Admitting: Pulmonary Disease

## 2015-01-12 DIAGNOSIS — J438 Other emphysema: Secondary | ICD-10-CM

## 2015-01-12 NOTE — Telephone Encounter (Signed)
Pt c/o bad taste in mouth, prod cough (Cream- green), fatigue, mild sob, sinus drainage (ocas blood).  Denies fever.  Please advise.

## 2015-01-12 NOTE — Telephone Encounter (Signed)
I need him to come by and give us a sputum sample since he is having recurrent symptoms.  It is best if he can come first thing in the morning, or if he can get a sputum cup and take home.  The early morning specimen is best, and need to get to the lab no later than 2 hrs.  Can keep in the fridge until then.  Once he gives the specimen, can send in abx.  If this keeps recurring, may need to xray sinuses.

## 2015-01-12 NOTE — Telephone Encounter (Signed)
lmtcb X1 for pt  

## 2015-01-12 NOTE — Telephone Encounter (Signed)
Called and spoke with pt and he stated that he will try and come by in the morning to leave the sample in the lab.  The order has been placed.  Will forward to mindy to follow up on once the pt comes into the lab.

## 2015-01-13 ENCOUNTER — Other Ambulatory Visit: Payer: Medicaid Other

## 2015-01-14 ENCOUNTER — Telehealth: Payer: Self-pay | Admitting: Pulmonary Disease

## 2015-01-14 MED ORDER — LEVOFLOXACIN 750 MG PO TABS: 750.0000 mg | ORAL_TABLET | Freq: Every day | ORAL | Status: DC

## 2015-01-14 NOTE — Telephone Encounter (Signed)
Pt returning call.Brent Oneal ° °

## 2015-01-14 NOTE — Telephone Encounter (Signed)
Per 01/12/15 phone note: Barbaraann ShareKeith M Clance, MD at 01/12/2015 5:05 PM     Status: Signed       Expand All Collapse All   I need him to come by and give us a sputum sample since he is having recurrent symptoms. It is best if he can come first thing in the morning, or if he can get a sputum cup and take home. The early morning specimen is best, and need to get to the lab no later than 2 hrs. Can keep in the fridge until then. Once he gives the specimen, can send in abx. If this keeps recurring, may need to xray sinuses      ---  Pt dropped off specimen yesterday. He is now calling for ABX to be called in. Please advise KC thanks

## 2015-01-14 NOTE — Telephone Encounter (Signed)
Called pt and aware of recs. Nothing further needed and RX sent in

## 2015-01-14 NOTE — Telephone Encounter (Signed)
Calling to check on specimen brought in yesterday, thinks KC is suppose to be prescribing meds for him to take start taking today

## 2015-01-14 NOTE — Telephone Encounter (Signed)
lmomtcb x1 on VM 

## 2015-01-14 NOTE — Telephone Encounter (Signed)
Ok to call in levaquin 750mg  one a day for 5 days Will call him with results of culture.

## 2015-01-17 ENCOUNTER — Telehealth: Payer: Self-pay | Admitting: Pulmonary Disease

## 2015-01-17 LAB — LOWER RESPIRATORY CULTURE

## 2015-01-17 MED ORDER — PREDNISONE 10 MG PO TABS: ORAL_TABLET | ORAL | Status: DC

## 2015-01-17 NOTE — Telephone Encounter (Signed)
Finish up his current abx.  Can have a 6 day pred taper (40/40/30/30/20/20)

## 2015-01-17 NOTE — Telephone Encounter (Signed)
Patient given results from sputum culture.  Patient would like to know if he should just finish the antibiotics he is currently on.  He wants to know if he should get some prednisone because his chest feels really tight.

## 2015-01-17 NOTE — Telephone Encounter (Signed)
Spoke with pt.  Discussed below recs per Dr .Shelle Ironlance.  He verbalized understanding, is aware [facility], and is to call back or seek emergency care if needed if symptoms do not improve or worsen.

## 2015-02-02 ENCOUNTER — Other Ambulatory Visit: Payer: Self-pay | Admitting: Pulmonary Disease

## 2015-02-03 ENCOUNTER — Telehealth: Payer: Self-pay | Admitting: Pulmonary Disease

## 2015-02-03 NOTE — Telephone Encounter (Signed)
Pt needs symbicort  , [place]    [phone] pt is out of symbicort

## 2015-02-03 NOTE — Telephone Encounter (Signed)
lmomtcb x1 for pt 

## 2015-02-03 NOTE — Telephone Encounter (Signed)
PA for Tudorza requested.  Form in Mindy's box to be signed by [facility].   To Mindy for f/u.Marland Kitchen. See message below from [place].  Thanks.

## 2015-03-07 ENCOUNTER — Telehealth: Payer: Self-pay | Admitting: Pulmonary Disease

## 2015-03-07 NOTE — Telephone Encounter (Signed)
lmtcb X1 

## 2015-03-07 NOTE — Telephone Encounter (Signed)
States saw PCP last week and wouldn't give him rx for prednisone, [facility] pt cb [phone]

## 2015-03-08 NOTE — Telephone Encounter (Signed)
lmtcb X1 for pt  

## 2015-03-08 NOTE — Telephone Encounter (Signed)
Let pt know that he needed an ov and he made one, still wants to know about meds.Caren GriffinsStanley A Dalton

## 2015-03-08 NOTE — Telephone Encounter (Signed)
Ok to call in prednisone 20mg , one a day until visit. #8, no fills.  If he worsens btw now and his visit, needs to go to ER>

## 2015-03-08 NOTE — Telephone Encounter (Signed)
He needs ov, and if worsening overnight, can go to ER>

## 2015-03-08 NOTE — Telephone Encounter (Signed)
LMTC x 1  

## 2015-03-08 NOTE — Telephone Encounter (Signed)
Pt requesting refill of prednisone. Pt currently taking Levaquin 500 daily x 10 days - was unable to tell me how many days left of abx Pt completed Pred 20mg  on Friday  03/04/15 - was taking 2 tablets daily x 5 days Pt c/o a lot of chest congestion, SOB and wheezing.   Allergies  Allergen Reactions  . Amitriptyline Other (See Comments)    Blacked out Went crazy-did not know what was going on.  . Celecoxib Rash   Please advise Dr Shelle Ironlance. Thanks.

## 2015-03-08 NOTE — Telephone Encounter (Signed)
Spoke with pt, he is wanting some prednisone to last him until his 4/11 appt with KC.  I offered to move appt up to tomorrow morning at 9:00 to discuss, pt states he has to schedule appts out at least 5 days.    KC please advise on pt's prednisone rx in the meantime.  Thanks!

## 2015-03-09 ENCOUNTER — Encounter: Payer: Self-pay | Admitting: Pulmonary Disease

## 2015-03-09 ENCOUNTER — Ambulatory Visit (INDEPENDENT_AMBULATORY_CARE_PROVIDER_SITE_OTHER): Payer: Medicaid Other | Admitting: Pulmonary Disease

## 2015-03-09 ENCOUNTER — Telehealth: Payer: Self-pay | Admitting: Pulmonary Disease

## 2015-03-09 VITALS — BP 138/76 | HR 99 | Temp 97.2°F | Ht 68.0 in | Wt 172.4 lb

## 2015-03-09 DIAGNOSIS — J441 Chronic obstructive pulmonary disease with (acute) exacerbation: Secondary | ICD-10-CM | POA: Diagnosis not present

## 2015-03-09 DIAGNOSIS — J438 Other emphysema: Secondary | ICD-10-CM

## 2015-03-09 DIAGNOSIS — J9611 Chronic respiratory failure with hypoxia: Secondary | ICD-10-CM | POA: Diagnosis not present

## 2015-03-09 MED ORDER — PREDNISONE 20 MG PO TABS: 20.0000 mg | ORAL_TABLET | Freq: Every day | ORAL | Status: DC

## 2015-03-09 MED ORDER — AZITHROMYCIN 250 MG PO TABS: ORAL_TABLET | ORAL | Status: DC

## 2015-03-09 MED ORDER — PREDNISONE 10 MG PO TABS: ORAL_TABLET | ORAL | Status: DC

## 2015-03-09 NOTE — Telephone Encounter (Signed)
Pt is aware of KC's recs. He has an appointment this morning. Nothing further was needed.

## 2015-03-09 NOTE — Progress Notes (Signed)
   Subjective:    Patient ID: Brent Oneal, male    DOB: [birth year], 49 y.o.   MRN: [id number]  HPI The patient comes in today for an acute sick visit. He has known severe emphysema with chronic respiratory failure, and has been having recurrent pulmonary infections with acute exacerbations. He is no longer smoking, but does use a vapor device on a regular basis. He denies noncompliance with his medications, and does not feel that he is having any symptoms of aspiration or reflux. He also denies recurrent sinus disease and symptoms. He currently is on antibiotic from his primary doctor for chest congestion and cough with purulence, but is also having increased shortness of breath. He has had a recent sputum culture that grew heavy growth of Haemophilus influenza. He has not had a recent chest CT for bronchiectasis.   Review of Systems  Constitutional: Negative for fever and unexpected weight change.  HENT: Positive for congestion. Negative for dental problem, ear pain, nosebleeds, postnasal drip, rhinorrhea, sinus pressure, sneezing, sore throat and trouble swallowing.   Eyes: Negative for redness and itching.  Respiratory: Positive for cough, chest tightness, shortness of breath and wheezing.   Cardiovascular: Negative for palpitations and leg swelling.  Gastrointestinal: Negative for nausea and vomiting.  Genitourinary: Negative for dysuria.  Musculoskeletal: Negative for joint swelling.  Skin: Negative for rash.  Neurological: Negative for headaches.  Hematological: Does not bruise/bleed easily.  Psychiatric/Behavioral: Negative for dysphoric mood. The patient is not nervous/anxious.        Objective:   Physical Exam Overweight male in no acute distress Nose without purulence or discharge noted Neck without lymphadenopathy or thyromegaly Chest with very distant breath sounds, a few expiratory wheezes Cardiac exam with regular rate and rhythm Lower extremities with 1+ edema, no  cyanosis Alert and oriented, moves all 4 extremities.       Assessment & Plan:

## 2015-03-09 NOTE — Telephone Encounter (Signed)
So we had on spiriva, wouldn't cover, changed to [place].  Now won't cover tudorza.  See if they will cover spiriva or incruse.

## 2015-03-09 NOTE — Telephone Encounter (Signed)
[facility] at [facility], they received 2 Rx's for Prednisone and were not sure which to fill. I advised that the patient needed the Pred 10mg  taper filled at this time.   Hesham also stated that the patients Tudorza needs PA, states that the patient has not had this filled in 2 months because his insurance is not covering this.  Please advise Dr Shelle Ironlance if there is an alternative you would like to change the patient to or if we need to do PA.  Thanks.

## 2015-03-09 NOTE — Telephone Encounter (Signed)
Pt is calling back [phone]

## 2015-03-09 NOTE — Assessment & Plan Note (Signed)
The patient continues to have frequent acute exacerbations requiring antibiotics and prednisone for treatment. He tells me that he is not smoking, but he is using a vapor. He denies noncompliance with his inhaled medications, sinus symptoms, and reflux or aspiration symptoms. He does have very severe emphysema, and he is asking about chronic prednisone on a daily basis. I would like to try him on azithromycin 3 times a week first, and see how he responds. I have also asked him to try and move toward getting off his VAPE. Finally, we'll check a high-resolution CT chest to evaluate for bronchiectasis in light of his recurring pulmonary infections. I have also had a discussion with him today about possible transplant in the future, and he is getting ready [facility] in the next few weeks. At some point [facility] will need [facility].

## 2015-03-09 NOTE — Patient Instructions (Signed)
Finish up your antibiotic, and will put on 8 day prednisone taper. Once you finish your antibiotic, start on azithromycin 250mg  mon/wed/frid only weekly Will check scan of your chest to look for "pockets of infection".  Will call you with results. Try and work toward stopping VAPE.

## 2015-03-10 ENCOUNTER — Telehealth: Payer: Self-pay | Admitting: Pulmonary Disease

## 2015-03-10 ENCOUNTER — Telehealth: Payer: Self-pay | Admitting: *Deleted

## 2015-03-10 MED ORDER — TIOTROPIUM BROMIDE MONOHYDRATE 18 MCG IN CAPS: 18.0000 ug | ORAL_CAPSULE | Freq: Every day | RESPIRATORY_TRACT | Status: DC

## 2015-03-10 MED ORDER — UMECLIDINIUM BROMIDE 62.5 MCG/INH IN AEPB: 1.0000 | INHALATION_SPRAY | Freq: Every day | RESPIRATORY_TRACT | Status: DC

## 2015-03-10 NOTE — Telephone Encounter (Signed)
Fax received from cover my meds with [place] [id number] For PA on Tudoza PA was initiated Allow 5 business days for decision which will be faxed to office.

## 2015-03-10 NOTE — Telephone Encounter (Signed)
Pt is aware that he is to take the 10mg  prednisone taper. Advised him that he could finish the antibiotic he had but he needed to make sure that he takes the antibiotic prescribed for M,W.F. Nothing further was needed.

## 2015-03-10 NOTE — Telephone Encounter (Signed)
Pt is aware of the medication change. Rx for Incruse will be sent in. Nothing further was needed.

## 2015-03-10 NOTE — Telephone Encounter (Signed)
Left message for pt to return our call. 

## 2015-03-10 NOTE — Telephone Encounter (Signed)
calling to get PA for Tudorza and incruse need PA  [place]- cover my meds pharmacy [phone]

## 2015-03-10 NOTE — Telephone Encounter (Signed)
Per 03/09/15 phone note: Brent ShareKeith M Clance, MD at 03/09/2015 5:35 PM     Status: Signed       Expand All Collapse All   So we had on spiriva, wouldn't cover, changed to tudorza. Now won't cover tudorza. See if they will cover spiriva or incruse      --  tudorza was changed to incruse. incruse requires PA now. Called spoke with [facility]. Per Porfirio Mylararmen the pharmacists spiriva HH is now covered but no the respimat. I have called RX into the pharmacy. Called pt to make aware of change and LMTCB x1 Will also make [facility] aware

## 2015-03-14 ENCOUNTER — Ambulatory Visit: Payer: Medicaid Other | Admitting: Pulmonary Disease

## 2015-03-16 ENCOUNTER — Inpatient Hospital Stay: Admission: RE | Admit: 2015-03-16 | Payer: Medicaid Other | Source: Ambulatory Visit

## 2015-03-16 NOTE — Telephone Encounter (Signed)
Called patient to notify that inhaler has been changed to Spiriva. Pharmacist notified office that Spiriva is covered with no PA required. Rx has been sent to his pharmacy. Pt answered phone and said I will have to call you back and hung up before I could speak.

## 2015-03-17 ENCOUNTER — Telehealth: Payer: Self-pay | Admitting: Pulmonary Disease

## 2015-03-17 ENCOUNTER — Other Ambulatory Visit: Payer: Self-pay | Admitting: Pulmonary Disease

## 2015-03-17 MED ORDER — ALBUTEROL SULFATE HFA 108 (90 BASE) MCG/ACT IN AERS: INHALATION_SPRAY | RESPIRATORY_TRACT | Status: DC

## 2015-03-17 NOTE — Telephone Encounter (Signed)
Pt aware RX sent in. Nothing further needed 

## 2015-03-21 ENCOUNTER — Telehealth: Payer: Self-pay | Admitting: *Deleted

## 2015-03-21 NOTE — Telephone Encounter (Signed)
Called patient back and let him know that his insurance is requiring in to try and fail Spiriva. Patient says he has been on Spiriva before and he had trouble with capsule not having powder inside of it. I told him to try Spiriva again but it it has the same problem then let our office know and we will submit PA for Tudorza.

## 2015-03-22 ENCOUNTER — Telehealth: Payer: Self-pay | Admitting: Pulmonary Disease

## 2015-03-22 NOTE — Telephone Encounter (Signed)
done

## 2015-03-22 NOTE — Telephone Encounter (Signed)
Form has been faxed back and will await approval/denial

## 2015-03-22 NOTE — Telephone Encounter (Signed)
Form received from [place] office for the patient's medication coverage for Incruse.  Sonya requests the questions 5 & 6 be looked and answered if able. Please sign and fax back to [place].  Fax # [phone]  Please advise Dr Shelle Ironlance. Thanks.

## 2015-03-23 ENCOUNTER — Other Ambulatory Visit: Payer: Self-pay | Admitting: *Deleted

## 2015-03-23 MED ORDER — ACLIDINIUM BROMIDE 400 MCG/ACT IN AEPB: INHALATION_SPRAY | RESPIRATORY_TRACT | Status: DC

## 2015-03-23 NOTE — Progress Notes (Signed)
Brent Oneal has been approved for 1 year effective 03/23/15. Authorization # [id number].

## 2015-03-29 ENCOUNTER — Inpatient Hospital Stay: Admission: RE | Admit: 2015-03-29 | Payer: Medicaid Other | Source: Ambulatory Visit

## 2015-03-30 NOTE — Telephone Encounter (Signed)
Will forward to Sonya to f/u on since I have not received anything. Please advise thanks 

## 2015-03-30 NOTE — Telephone Encounter (Signed)
Brent Oneal was appealed and approved. Pt has picked up rx and doing well. Approval # [id number] valid thru 03/20/16. Nothing further needed.

## 2015-04-01 ENCOUNTER — Other Ambulatory Visit: Payer: Self-pay | Admitting: Pulmonary Disease

## 2015-04-06 ENCOUNTER — Ambulatory Visit (INDEPENDENT_AMBULATORY_CARE_PROVIDER_SITE_OTHER)
Admission: RE | Admit: 2015-04-06 | Discharge: 2015-04-06 | Disposition: A | Discharge status: Home / Self Care | Payer: Medicaid Other | Source: Ambulatory Visit | Attending: Pulmonary Disease | Admitting: Pulmonary Disease

## 2015-04-06 DIAGNOSIS — J441 Chronic obstructive pulmonary disease with (acute) exacerbation: Secondary | ICD-10-CM

## 2015-04-11 ENCOUNTER — Telehealth: Payer: Self-pay | Admitting: Pulmonary Disease

## 2015-04-11 NOTE — Telephone Encounter (Signed)
Would like to see pt back the first week of June to see how things are going.

## 2015-04-12 NOTE — Telephone Encounter (Signed)
Pt is already scheduled for 05/04/15 with you. Thanks

## 2015-05-04 ENCOUNTER — Encounter: Payer: Self-pay | Admitting: Pulmonary Disease

## 2015-05-04 ENCOUNTER — Ambulatory Visit (INDEPENDENT_AMBULATORY_CARE_PROVIDER_SITE_OTHER): Payer: Medicaid Other | Admitting: Pulmonary Disease

## 2015-05-04 VITALS — BP 138/72 | HR 80 | Ht 68.0 in | Wt 172.4 lb

## 2015-05-04 DIAGNOSIS — J438 Other emphysema: Secondary | ICD-10-CM

## 2015-05-04 DIAGNOSIS — R911 Solitary pulmonary nodule: Secondary | ICD-10-CM | POA: Diagnosis not present

## 2015-05-04 DIAGNOSIS — J9611 Chronic respiratory failure with hypoxia: Secondary | ICD-10-CM

## 2015-05-04 NOTE — Patient Instructions (Signed)
Stay on current meds, including 3 times a week azithromycin [facility], work on [employer] loss Will schedule for followup scan of your chest in August followup with Dr. Delton CoombesByrum the end of august.

## 2015-05-04 NOTE — Assessment & Plan Note (Signed)
The patient had a left upper lobe groundglass nodule noted on his recent CT chest, and this will need to be followed up in August of this year per Fleischner criteria.

## 2015-05-04 NOTE — Assessment & Plan Note (Signed)
The patient has done fairly well since his last visit on 3 times a week azithromycin.  He feels that his breathing is at baseline, but continues to have significant dyspnea on exertion as expected given the severity of his lung disease. I hope this is at least decreases the frequency of his acute exacerbations. He is not smoking cigarettes, but is using a vapor device. I have asked him to work toward discontinuation. I also stressed to him the importance of pulmonary rehabilitation, and [facility]. Ultimately, he is going to need referral for lung transplantation in the future.

## 2015-05-04 NOTE — Progress Notes (Signed)
   Subjective:    Patient ID: Brent Oneal, male    DOB: 10-27-1966, 49 y.o.   MRN: [id number]  HPI The patient comes in today for follow-up of his severe emphysema with chronic respiratory failure. He was having recurrent exacerbations, and a high-resolution CT did not show any bronchiectasis. He did have a groundglass nodule in the left upper lobe that we'll need close follow-up. The patient was started on azithromycin 3 days a week to see if we could prevent his exacerbations, and he has done well since his last visit.   Review of Systems  Constitutional: Negative for fever and unexpected weight change.  HENT: Negative for congestion, dental problem, ear pain, nosebleeds, postnasal drip, rhinorrhea, sinus pressure, sneezing, sore throat and trouble swallowing.   Eyes: Positive for redness. Negative for itching.  Respiratory: Positive for cough and shortness of breath. Negative for chest tightness and wheezing.   Cardiovascular: Positive for leg swelling. Negative for palpitations.  Gastrointestinal: Negative for nausea and vomiting.  Genitourinary: Positive for dysuria.  Musculoskeletal: Negative for joint swelling.  Skin: Negative for rash.  Neurological: Negative for headaches.  Hematological: Does not bruise/bleed easily.  Psychiatric/Behavioral: Negative for dysphoric mood. The patient is not nervous/anxious.        Objective:   Physical Exam Overweight male in no acute distress Nose without purulence or discharge noted Neck without lymphadenopathy or thyromegaly Chest with fairly clear breath sounds, adequate airflow Cardiac exam with regular rate and rhythm Lower extremities with 1+ edema, no cyanosis Alert and oriented, moves all 4 extremities.       Assessment & Plan:

## 2015-06-07 ENCOUNTER — Telehealth: Payer: Self-pay | Admitting: Pulmonary Disease

## 2015-06-07 NOTE — Telephone Encounter (Signed)
lmtcb

## 2015-06-08 MED ORDER — BUDESONIDE-FORMOTEROL FUMARATE 160-4.5 MCG/ACT IN AERO: 2.0000 | INHALATION_SPRAY | Freq: Two times a day (BID) | RESPIRATORY_TRACT | Status: DC

## 2015-06-08 NOTE — Telephone Encounter (Signed)
Rx for Symbicort sent to pharmacy. Patient notified via voicemail. Nothing further needed.

## 2015-06-08 NOTE — Telephone Encounter (Signed)
160/4.5 symbicort, NOT spiriva, pt cb [phone]

## 2015-06-08 NOTE — Telephone Encounter (Signed)
Called pt. He is unsure which spiriva he is taking and will give us a call back when he gets home to let us know as we don't have spiriva on his current medication list

## 2015-06-22 ENCOUNTER — Ambulatory Visit: Payer: Medicaid Other | Admitting: Pulmonary Disease

## 2015-06-24 ENCOUNTER — Other Ambulatory Visit: Payer: Self-pay | Admitting: Emergency Medicine

## 2015-06-24 DIAGNOSIS — R911 Solitary pulmonary nodule: Secondary | ICD-10-CM

## 2015-07-19 ENCOUNTER — Other Ambulatory Visit: Payer: Self-pay | Admitting: *Deleted

## 2015-07-19 ENCOUNTER — Telehealth: Payer: Self-pay | Admitting: Pulmonary Disease

## 2015-07-19 MED ORDER — ALBUTEROL SULFATE HFA 108 (90 BASE) MCG/ACT IN AERS: INHALATION_SPRAY | RESPIRATORY_TRACT | Status: DC

## 2015-07-19 MED ORDER — ACLIDINIUM BROMIDE 400 MCG/ACT IN AEPB: INHALATION_SPRAY | RESPIRATORY_TRACT | Status: DC

## 2015-07-19 NOTE — Telephone Encounter (Signed)
Previous KC pt that was seen last 05/04/15 and was informed in AVS to f/u with you end of August. appt has not been scheduled. Pt is needing refill on Proair and Turdoza. Is it ok to refill these medications. Please advise.

## 2015-07-19 NOTE — Telephone Encounter (Signed)
Yes okay to refill  

## 2015-07-19 NOTE — Telephone Encounter (Signed)
lmomtcb x1 for pt RX's sent in

## 2015-07-20 NOTE — Telephone Encounter (Signed)
Spoke with pt, aware of refills being sent in.  Nothing further needed.

## 2015-07-20 NOTE — Telephone Encounter (Signed)
Patient returned call, please call [phone].

## 2015-07-20 NOTE — Telephone Encounter (Signed)
lmtcb for pt.  

## 2015-07-25 ENCOUNTER — Inpatient Hospital Stay: Admission: RE | Admit: 2015-07-25 | Payer: Medicaid Other | Source: Ambulatory Visit

## 2015-08-03 ENCOUNTER — Ambulatory Visit (INDEPENDENT_AMBULATORY_CARE_PROVIDER_SITE_OTHER)
Admission: RE | Admit: 2015-08-03 | Discharge: 2015-08-03 | Disposition: A | Discharge status: Home / Self Care | Payer: Medicaid Other | Source: Ambulatory Visit | Attending: Emergency Medicine | Admitting: Emergency Medicine

## 2015-08-03 DIAGNOSIS — R911 Solitary pulmonary nodule: Secondary | ICD-10-CM

## 2015-09-06 ENCOUNTER — Telehealth: Payer: Self-pay | Admitting: Emergency Medicine

## 2015-09-06 NOTE — Telephone Encounter (Signed)
Spoke with pt, states he is in [facility] and his providers there are discussing weaning him off of his 68.  I advised pt that he would not be taken off his 02 if his sats show he needs it. Pt asked if our providers could "show some muscle" in the hospital.  I advised that our providers do not have privileges at [facility], and he should discuss his concerns with the provider overseeing his care in [place].  I again advised pt that if his 02 sats show he needs to stay on his 02 he would not be taken off his 02.  Pt expressed understanding.  Nothing further needed.

## 2015-09-30 ENCOUNTER — Ambulatory Visit: Payer: Medicaid Other | Admitting: Emergency Medicine

## 2015-10-10 ENCOUNTER — Other Ambulatory Visit: Payer: Self-pay | Admitting: Emergency Medicine

## 2015-10-12 ENCOUNTER — Telehealth: Payer: Self-pay | Admitting: *Deleted

## 2015-10-12 MED ORDER — AZITHROMYCIN 250 MG PO TABS: ORAL_TABLET | ORAL | Status: DC

## 2015-10-12 NOTE — Telephone Encounter (Signed)
Patient calling because he needs refill on his Zithromax.  Per Dr. Teddy Spikelance's last OV note, patient takes Zithromax 3 days a week.  Patient has appointment to see Dr. Delton CoombesByrum on 11/03/15.  Gave 1 refill of Zithromax to hold him over until he sees Dr. Delton CoombesByrum.  Patient reminded of appointment with Dr. Delton CoombesByrum, he was not aware he had an appointment scheduled already.  Gave him his appointment information and he confirmed that he will be here for the appointment. Nothing further needed. Closing encounter.

## 2015-11-03 ENCOUNTER — Ambulatory Visit: Payer: Medicaid Other | Admitting: Emergency Medicine

## 2015-11-04 ENCOUNTER — Other Ambulatory Visit: Payer: Self-pay | Admitting: Emergency Medicine

## 2015-11-07 ENCOUNTER — Telehealth: Payer: Self-pay | Admitting: Emergency Medicine

## 2015-11-07 NOTE — Telephone Encounter (Signed)
LMOMTCB x1 for pt 

## 2015-11-08 NOTE — Telephone Encounter (Signed)
Former [facility] patient. Pt needing refills of Proair, Symbicort and Zithromax 250mg  to [facility].  Pt has upcoming appt with RB 01/30/16 as new patient - please advise Dr Delton CoombesByrum if okay to refill these meds. Thanks.

## 2015-11-08 NOTE — Telephone Encounter (Signed)
Yes OK to refill all

## 2015-11-09 MED ORDER — BUDESONIDE-FORMOTEROL FUMARATE 160-4.5 MCG/ACT IN AERO: INHALATION_SPRAY | RESPIRATORY_TRACT | Status: DC

## 2015-11-09 MED ORDER — ALBUTEROL SULFATE HFA 108 (90 BASE) MCG/ACT IN AERS: INHALATION_SPRAY | RESPIRATORY_TRACT | Status: DC

## 2015-11-09 MED ORDER — AZITHROMYCIN 250 MG PO TABS: ORAL_TABLET | ORAL | Status: DC

## 2015-11-09 NOTE — Telephone Encounter (Signed)
Called spoke with pt. Aware refills sent in. Nothing further needed

## 2015-12-06 ENCOUNTER — Other Ambulatory Visit: Payer: Self-pay

## 2015-12-06 MED ORDER — ACLIDINIUM BROMIDE 400 MCG/ACT IN AEPB: INHALATION_SPRAY | RESPIRATORY_TRACT | Status: DC

## 2015-12-07 ENCOUNTER — Telehealth: Payer: Self-pay | Admitting: Emergency Medicine

## 2015-12-07 MED ORDER — ACLIDINIUM BROMIDE 400 MCG/ACT IN AEPB: INHALATION_SPRAY | RESPIRATORY_TRACT | Status: DC

## 2015-12-07 NOTE — Telephone Encounter (Signed)
Rx sent to pharm with note to pharmacist that she needs ov before any refills

## 2016-01-30 ENCOUNTER — Ambulatory Visit: Payer: Medicaid Other | Admitting: Emergency Medicine

## 2016-02-01 ENCOUNTER — Other Ambulatory Visit: Payer: Self-pay | Admitting: Emergency Medicine

## 2016-02-01 ENCOUNTER — Telehealth: Payer: Self-pay | Admitting: Emergency Medicine

## 2016-02-01 MED ORDER — ACLIDINIUM BROMIDE 400 MCG/ACT IN AEPB: INHALATION_SPRAY | RESPIRATORY_TRACT | Status: DC

## 2016-02-01 MED ORDER — AZITHROMYCIN 250 MG PO TABS: ORAL_TABLET | ORAL | Status: DC

## 2016-02-01 MED ORDER — BUDESONIDE-FORMOTEROL FUMARATE 160-4.5 MCG/ACT IN AERO: INHALATION_SPRAY | RESPIRATORY_TRACT | Status: DC

## 2016-02-01 NOTE — Telephone Encounter (Signed)
Spoke with the pt and notified rxs will be refilled but needs to keep appt  I have rescheduled him per his request  Nothing further needed

## 2016-02-01 NOTE — Telephone Encounter (Signed)
[phone], pt cb

## 2016-02-01 NOTE — Telephone Encounter (Signed)
lmtcb X1 for pt. Pt is a former [facility] pt, scheduled to see RB next month.

## 2016-03-08 ENCOUNTER — Ambulatory Visit: Payer: Medicaid Other | Admitting: Emergency Medicine

## 2016-03-22 ENCOUNTER — Encounter: Payer: Self-pay | Admitting: Emergency Medicine

## 2016-03-22 ENCOUNTER — Ambulatory Visit (INDEPENDENT_AMBULATORY_CARE_PROVIDER_SITE_OTHER): Payer: Medicaid Other | Admitting: Emergency Medicine

## 2016-03-22 VITALS — BP 152/78 | HR 81 | Ht 68.0 in | Wt 165.0 lb

## 2016-03-22 DIAGNOSIS — R911 Solitary pulmonary nodule: Secondary | ICD-10-CM | POA: Diagnosis not present

## 2016-03-22 DIAGNOSIS — J449 Chronic obstructive pulmonary disease, unspecified: Secondary | ICD-10-CM | POA: Diagnosis not present

## 2016-03-22 DIAGNOSIS — F17200 Nicotine dependence, unspecified, uncomplicated: Secondary | ICD-10-CM | POA: Diagnosis not present

## 2016-03-22 DIAGNOSIS — J438 Other emphysema: Secondary | ICD-10-CM | POA: Diagnosis not present

## 2016-03-22 NOTE — Progress Notes (Signed)
Subjective:    Patient ID: Brent Oneal, male    DOB: Nov 17, 1966, 49 y.o.   MRN: [id number]  HPI 50 year old smoker who has been followed by Dr. Shelle Iron in our office for emphysematous COPD, chronic hypoxemia, and a left upper lobe groundglass pulmonary nodules CT scan of the chest. His most recent CT was 08/03/15 the personal or reviewed. Showed some resolution of prominent left upper lobe groundglass opacity but persistence of a smaller left upper lobe and left lower lobe patchy area of groundglass. Also noted was diffuse bronchial wall thickening and emphysematous change consistent with severe COPD. Is F1 antitrypsin was 166 g/dL 4 years ago, genotype MM.  He had a URI last month, caused dyspnea and bronchitis. He was treated at the [facility] ED, was treated w abx. He is on azithro TIW chronically. On symbicort and tudorza. No chronic pred. Last pred was probably by his PCP few months ago - he isn't sure.  He is smoking about 3 cig a week. He doesn;t have a plan to stop - discussed today. He uses SABA several times a week.   Review of Systems Per HPI  Past Medical History  Diagnosis Date  . Hyperlipidemia   . Allergic   . COPD (chronic obstructive pulmonary disease) (HCC)   . Hypertension      Family History  Problem Relation Age of Onset  . Emphysema Father   . Allergies Maternal Grandmother   . Allergies Maternal Grandfather   . Asthma Father   . Heart attack Father   . Bone cancer Mother      Social History   Social History  . Marital Status: Legally Separated    Spouse Name: N/A  . Number of Children: N/A  . Years of Education: N/A   Occupational History  . disabled.     Social History Main Topics  . Smoking status: Current Every Day Smoker -- 0.25 packs/day for 30 years    Types: Cigarettes  . Smokeless tobacco: Not on file     Comment: using vapor.  . Alcohol Use: No  . Drug Use: No  . Sexual Activity: Not on file   Other Topics Concern  . Not on file    Social History Narrative   Lives with house mates.           Allergies  Allergen Reactions  . Amitriptyline Other (See Comments)    Blacked out Went crazy-did not know what was going on.  . Celecoxib Rash     Outpatient Prescriptions Prior to Visit  Medication Sig Dispense Refill  . Aclidinium Bromide (TUDORZA PRESSAIR) 400 MCG/ACT AEPB INHALE 1 PUFF INTO THE LUNGS 2 TIMES DAILY. 1 each 1  . Ascorbic Acid (VITAMIN C) 1000 MG tablet Take 1,000 mg by mouth daily.    Marland Kitchen azithromycin (ZITHROMAX) 250 MG tablet Take on Monday, Wednesday and friday 12 tablet 2  . budesonide-formoterol (SYMBICORT) 160-4.5 MCG/ACT inhaler INHALE 2 PUFFS INTO THE LUNGS 2 TIMES DAILY. 1 Inhaler 2  . METHADONE HCL PO Take 145 mg by mouth daily. Takes throughout the day.    . Potassium Gluconate 595 MG CAPS Take 1 capsule by mouth daily.    Marland Kitchen PROAIR HFA 108 (90 Base) MCG/ACT inhaler INHALE 2 PUFFS INTO THE LUNGS EVERY 6 HOURS AS NEEDED FOR BREATHING 1 Inhaler 0   No facility-administered medications prior to visit.         Objective:   Physical Exam Filed Vitals:   03/22/16 0929 03/22/16  0930  BP:  152/78  Pulse:  81  Height: 5\' 8"  (1.727 m)   Weight: 165 lb (74.844 kg)   SpO2:  93%   Gen: Pleasant, well-nourished, in no distress,  normal affect, on O2  ENT: No lesions,  mouth clear,  oropharynx clear, no postnasal drip  Neck: No JVD, no TMG, no carotid bruits  Lungs: No use of accessory muscles, quite distant with prolonged expiration, and expiratory wheeze bilaterally  Cardiovascular: RRR, heart sounds normal, no murmur or gallops, no peripheral edema  Musculoskeletal: No deformities, no cyanosis or clubbing  Neuro: alert, non focal  Skin: Warm, no lesions or rashes      Assessment & Plan:  Pulmonary nodule Repeat CT scan of the chest in August 2017. Follow-up to review the results.  NICOTINE ADDICTION Discussed smoking cessation today..  We discussed cigarette cessation today.  You are at the point where you should decide when you want to set a quit date. Then you need to prepare for the quit date by planning to use nicotine patch, get rid of cigarettes, ashtrays, lighters. Also nee to tell every one around you that you are quitting so they will avoid tempting you.    COPD (chronic obstructive pulmonary disease) with emphysema Continue your inhaled meds a you have been taking them  Continue your oxygen at 3-4L/min.  Follow with Dr Delton CoombesByrum in 5 months or sooner if you have any problems.

## 2016-03-22 NOTE — Patient Instructions (Signed)
We discussed cigarette cessation today. You are at the point where you should decide when you want to set a quit date. Then you need to prepare for the quit date by planning to use nicotine patch, get rid of cigarettes, ashtrays, lighters. Also nee to tell every one around you that you are quitting so they will avoid tempting you.  Continue your inhaled meds a you have been taking them  Continue your oxygen at 3-4L/min.  We will repeat your Ct scan of the chest in late August 2017.  Follow with Dr Delton CoombesByrum in 5 months or sooner if you have any problems.

## 2016-03-22 NOTE — Assessment & Plan Note (Signed)
Repeat CT scan of the chest in August 2017. Follow-up to review the results.

## 2016-03-22 NOTE — Assessment & Plan Note (Signed)
Discussed smoking cessation today..  We discussed cigarette cessation today. You are at the point where you should decide when you want to set a quit date. Then you need to prepare for the quit date by planning to use nicotine patch, get rid of cigarettes, ashtrays, lighters. Also nee to tell every one around you that you are quitting so they will avoid tempting you.

## 2016-03-22 NOTE — Assessment & Plan Note (Signed)
Continue your inhaled meds a you have been taking them  Continue your oxygen at 3-4L/min.  Follow with Dr Delton CoombesByrum in 5 months or sooner if you have any problems.

## 2016-04-02 ENCOUNTER — Other Ambulatory Visit: Payer: Self-pay | Admitting: Emergency Medicine

## 2016-04-03 ENCOUNTER — Other Ambulatory Visit: Payer: Self-pay | Admitting: Emergency Medicine

## 2016-04-13 ENCOUNTER — Telehealth: Payer: Self-pay | Admitting: Emergency Medicine

## 2016-04-13 NOTE — Telephone Encounter (Signed)
Spoke with Molly Maduroobert with [facility].  Brent Oneal requires [facility].  Pt states he has tried Spiriva Handihaler in the past which did not work as well for him.  PA form completed and placed in RB's look at for signature.

## 2016-04-16 ENCOUNTER — Other Ambulatory Visit: Payer: Self-pay | Admitting: Emergency Medicine

## 2016-04-16 NOTE — Telephone Encounter (Signed)
PA has been signed by RB. Form has been faxed back and placed in the blue accordion file in triage.

## 2016-04-20 NOTE — Telephone Encounter (Signed)
Called and spoke with [place] at [facility]. [place]orza has been approved 04/16/16 - 04/11/17. Authorization # : [id number]. Spoke with Talbert ForestShirley at [facility], they are aware that this has been approved. Nothing further was needed.

## 2016-05-02 ENCOUNTER — Other Ambulatory Visit: Payer: Self-pay | Admitting: Emergency Medicine

## 2016-05-21 ENCOUNTER — Telehealth: Payer: Self-pay | Admitting: Emergency Medicine

## 2016-05-21 MED ORDER — PREDNISONE 10 MG PO TABS: ORAL_TABLET | ORAL | Status: DC

## 2016-05-21 NOTE — Telephone Encounter (Signed)
RB - please advise. 

## 2016-05-21 NOTE — Telephone Encounter (Signed)
Pt c/o chest congestion with Javarian Jakubiak mucus production x 1 week.  Reports some cough, wheezing and SOB. Denies fever.  States that he ran a fever in the first few days.  Not using anything OTC for symptoms. Pt states that Mucinex does not give him any relief.   Requesting an Rx for Prednisone.   Allergies  Allergen Reactions  . Amitriptyline Other (See Comments)    Blacked out Went crazy-did not know what was going on.  . Celecoxib Rash   Please advise Dr Delton CoombesByrum. Thanks.

## 2016-05-21 NOTE — Telephone Encounter (Signed)
Spoke with pt. He is aware of RB's recommendation. Rx has been sent in. ROV has been scheduled for 05/24/16 at 3:45pm with RB. Nothing further was needed.

## 2016-05-21 NOTE — Telephone Encounter (Signed)
Ok to start Pred taper Take 40mg  daily for 3 days, then 30mg  daily for 3 days, then 20mg  daily for 3 days, then 10mg  daily for 3 days, then stop  Also - he needs to be seen by me or as an acute this week. He has hx ground glass nodules, not definitely clear that he is experiencing only a COPD exacerbation.

## 2016-05-21 NOTE — Telephone Encounter (Signed)
Pt is calling back needs predisone [phone]

## 2016-05-24 ENCOUNTER — Ambulatory Visit: Payer: Medicaid Other | Admitting: Emergency Medicine

## 2016-05-24 ENCOUNTER — Telehealth: Payer: Self-pay | Admitting: Emergency Medicine

## 2016-05-24 NOTE — Telephone Encounter (Signed)
atc pt X3, line rang to fast busy signal.  Wcb.  

## 2016-05-25 NOTE — Telephone Encounter (Signed)
Left message for patient to call back  

## 2016-05-28 NOTE — Telephone Encounter (Signed)
Patient calling to get appointment to be seen for SOB, Congestion.  Patient scheduled to see Dr. Sherene SiresWert tomorrow at 245pm,. Patient aware of appointment. Nothing further needed.

## 2016-05-29 ENCOUNTER — Ambulatory Visit: Payer: Medicaid Other | Admitting: Internal Medicine

## 2016-06-07 ENCOUNTER — Telehealth: Payer: Self-pay | Admitting: Emergency Medicine

## 2016-06-07 MED ORDER — PREDNISONE 10 MG PO TABS: ORAL_TABLET | ORAL | Status: DC

## 2016-06-07 NOTE — Telephone Encounter (Signed)
Called spoke with pt. Reviewed CY's recs and [facility] pharmacy [facility]. He voiced understanding and had no further questions. Rx sent. Nothing further needed.

## 2016-06-07 NOTE — Telephone Encounter (Signed)
Patient states that he has been having trouble breathing, he was on a prednisone taper, He has been without prednisone for a week.  He said that when he was taking the 2 tablets (10mg  each) daily, he did not have trouble breathing. He has not been breathing well since he stopped the prednisone.  Wants to get a refill on Prednisone.    Allergies  Allergen Reactions  . Amitriptyline Other (See Comments)    Blacked out Went crazy-did not know what was going on.  . Celecoxib Rash   Current Outpatient Prescriptions on File Prior to Visit  Medication Sig Dispense Refill  . Ascorbic Acid (VITAMIN C) 1000 MG tablet Take 1,000 mg by mouth daily.    Marland Kitchen. azithromycin (ZITHROMAX) 250 MG tablet TAKE ON MONDAY, WEDNESDAY AND FRIDAY 12 tablet 2  . METHADONE HCL PO Take 145 mg by mouth daily. Takes throughout the day.    . Potassium Gluconate 595 MG CAPS Take 1 capsule by mouth daily.    . predniSONE (DELTASONE) 10 MG tablet Take 4 tablets for 3 days, 3 tablets for 3 days, 2 tablets for 3 days, 1 tablet for 3 days 30 tablet 0  . PROAIR HFA 108 (90 Base) MCG/ACT inhaler INHALE 2 PUFFS INTO THE LUNGS EVERY 6 HOURS AS NEEDED FOR BREATHING 1 Inhaler 5  . SYMBICORT 160-4.5 MCG/ACT inhaler INHALE 2 PUFFS INTO LUNGS 2 TIMES DAILY 10.2 Inhaler 5  . TUDORZA PRESSAIR 400 MCG/ACT AEPB INHALE 1 PUFF INTO THE LUNGS 2 TIMES DAILY. 1 each 5   No current facility-administered medications on file prior to visit.

## 2016-06-07 NOTE — Telephone Encounter (Signed)
Prednisone 10 mg, # 60, ref x 1     1 or 2 tabs daily maintenance. Please call Dr Delton CoombesByrum in 2 weeks to update status and decide how to continue.

## 2016-07-02 ENCOUNTER — Other Ambulatory Visit: Payer: Self-pay | Admitting: Internal Medicine

## 2016-07-02 ENCOUNTER — Other Ambulatory Visit: Payer: Self-pay | Admitting: Emergency Medicine

## 2016-07-02 NOTE — Telephone Encounter (Signed)
RB pt 

## 2016-07-03 ENCOUNTER — Other Ambulatory Visit: Payer: Medicaid Other

## 2016-07-09 ENCOUNTER — Inpatient Hospital Stay: Admission: RE | Admit: 2016-07-09 | Payer: Medicaid Other | Source: Ambulatory Visit

## 2016-07-27 ENCOUNTER — Inpatient Hospital Stay: Admission: RE | Admit: 2016-07-27 | Payer: Medicaid Other | Source: Ambulatory Visit

## 2016-08-02 ENCOUNTER — Ambulatory Visit (INDEPENDENT_AMBULATORY_CARE_PROVIDER_SITE_OTHER)
Admission: RE | Admit: 2016-08-02 | Discharge: 2016-08-02 | Disposition: A | Discharge status: Home / Self Care | Payer: Medicaid Other | Source: Ambulatory Visit | Attending: Emergency Medicine | Admitting: Emergency Medicine

## 2016-08-02 DIAGNOSIS — J449 Chronic obstructive pulmonary disease, unspecified: Secondary | ICD-10-CM | POA: Diagnosis not present

## 2016-08-02 DIAGNOSIS — J438 Other emphysema: Secondary | ICD-10-CM | POA: Diagnosis not present

## 2016-08-03 ENCOUNTER — Telehealth: Payer: Self-pay | Admitting: Emergency Medicine

## 2016-08-03 NOTE — Telephone Encounter (Signed)
Spoke with Dr Cherlyn Labellahen-he is aware that RB is out of the office until 08-09-16. Dr Imogene Burnhen states this can wait until RB's return.

## 2016-08-07 NOTE — Telephone Encounter (Signed)
Please let Dr Imogene Burnhen know that methadone is acceptable but that he should try to use the lowest therapeutic dose given the risk for respiratory suppression.

## 2016-08-07 NOTE — Telephone Encounter (Signed)
Called and spoke with Dr. Imogene Burnhen regarding response below. Nothing further needed

## 2016-08-27 ENCOUNTER — Other Ambulatory Visit: Payer: Self-pay | Admitting: Emergency Medicine

## 2016-08-28 ENCOUNTER — Encounter: Payer: Self-pay | Admitting: Emergency Medicine

## 2016-08-28 ENCOUNTER — Ambulatory Visit (INDEPENDENT_AMBULATORY_CARE_PROVIDER_SITE_OTHER): Payer: Medicaid Other | Admitting: Emergency Medicine

## 2016-08-28 DIAGNOSIS — J84115 Respiratory bronchiolitis interstitial lung disease: Secondary | ICD-10-CM | POA: Diagnosis not present

## 2016-08-28 DIAGNOSIS — J438 Other emphysema: Secondary | ICD-10-CM

## 2016-08-28 DIAGNOSIS — Z23 Encounter for immunization: Secondary | ICD-10-CM | POA: Diagnosis not present

## 2016-08-28 NOTE — Assessment & Plan Note (Signed)
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 He does continue to smoke. At this time I will defer bronchoscopy although it may be necessary. I like to get him on a stable dose prednisone, taper this systematically. He will work hard on stopping smoking altogether his current exposure is minimal. Follow up with him in a month and we will determine when to reimage him at that time.

## 2016-08-28 NOTE — Assessment & Plan Note (Signed)
Please continue your inhaled Medications as you have been taking them Start using prednisone 15 mg daily every day. Do this for the next 2 weeks and then decrease to 10 mg daily every day. You need to work very hard on stopping smoking altogether. Your CT scan of the chest shows continued chronic inflammation near lungs probably from cigarette smoke Flu shot today Pneumonia shot is up-to-date Depending on your progress we may decide to perform a procedure called a bronchoscopy to evaluate your lungs further. Follow with Dr Irem Stoneham in 1 month  

## 2016-08-28 NOTE — Progress Notes (Signed)
Subjective:    Patient ID: Brent Oneal, male    DOB: 04/15/1966, 50 y.o.   MRN: [id number]  HPI 50 year old smoker who has been followed by Dr. Shelle Iron in our office for emphysematous COPD, chronic hypoxemia, and a left upper lobe groundglass pulmonary nodules CT scan of the chest. His most recent CT was 08/03/15 the personal or reviewed. Showed some resolution of prominent left upper lobe groundglass opacity but persistence of a smaller left upper lobe and left lower lobe patchy area of groundglass. Also noted was diffuse bronchial wall thickening and emphysematous change consistent with severe COPD. Is a1 antitrypsin was 166 g/dL 4 years ago, genotype MM.  He had a URI last month, caused dyspnea and bronchitis. He was treated at the [facility] ED, was treated w abx. He is on azithro TIW chronically. On symbicort and tudorza. No chronic pred. Last pred was probably by his PCP few months ago - he isn't sure.  He is smoking about 3 cig a week. He doesn;t have a plan to stop - discussed today. He uses SABA several times a week.   ROV 08/28/16 --  50 year old man with history of emphysematous COPD, associated chronic hypoxemic risk for failure, groundglass pulmonary nodules and been followed with serial CT scans of the chest. Since last visit he has been treated with prednisone for symptoms  Most recent CT scan of the chest 08/02/16 was reviewed by me. This shows mild patchy bilateral ground glass micronodular disease. 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 Allergic   . COPD (chronic obstructive pulmonary disease) (HCC)   . Hyperlipidemia   . Hypertension      Family History  Problem Relation Age of Onset  . Emphysema Father   . Allergies Maternal Grandmother   . Allergies Maternal Grandfather   . Asthma Father   . Heart attack Father   . Bone cancer Mother      Social History   Social History  . Marital status:  Legally Separated    Spouse name: N/A  . Number of children: N/A  . Years of education: N/A   Occupational History  . disabled.     Social History Main Topics  . Smoking status: Current Every Day Smoker    Packs/day: 0.25    Years: 30.00    Types: Cigarettes  . Smokeless tobacco: Never Used     Comment: using vapor, 3 cigarettes daily  . Alcohol use No  . Drug use: No  . Sexual activity: Not on file   Other Topics Concern  . Not on file   Social History Narrative   Lives with house mates.           Allergies  Allergen Reactions  . Amitriptyline Other (See Comments)    Blacked out Went crazy-did not know what was going on.  . Celecoxib Rash     Outpatient Medications Prior to Visit  Medication Sig Dispense Refill  . Ascorbic Acid (VITAMIN C) 1000 MG tablet Take 1,000 mg by mouth daily.    Marland Kitchen azithromycin (ZITHROMAX) 250 MG tablet TAKE 1 TABLET BY MOUTH ON MONDAY,WEDNESDAY, AND FRIDAY 12 tablet 0  . METHADONE HCL PO Take 145 mg by mouth daily. Takes throughout the day.    . Potassium Gluconate 595 MG CAPS Take 1 capsule by mouth daily.    . predniSONE (DELTASONE) 10 MG tablet Take 1 or 2 tablets  by mouth daily for maintenance 60 tablet 0  . PROAIR HFA 108 (90 Base) MCG/ACT inhaler INHALE 2 PUFFS INTO THE LUNGS EVERY 6 HOURS AS NEEDED FOR BREATHING 1 Inhaler 5  . SYMBICORT 160-4.5 MCG/ACT inhaler INHALE 2 PUFFS INTO LUNGS 2 TIMES DAILY 10.2 Inhaler 5  . TUDORZA PRESSAIR 400 MCG/ACT AEPB INHALE 1 PUFF INTO THE LUNGS 2 TIMES DAILY. 1 each 5  . predniSONE (DELTASONE) 10 MG tablet Take 4 tablets for 3 days, 3 tablets for 3 days, 2 tablets for 3 days, 1 tablet for 3 days (Patient not taking: Reported on 08/28/2016) 30 tablet 0   No facility-administered medications prior to visit.       Objective:   Physical Exam Vitals:   08/28/16 0906  BP: (!) 160/80  Pulse: (!) 102  SpO2: 94%  Weight: 157 lb 9.6 oz (71.5 kg)  Height: 5\' 8"  (1.727 m)   Gen: Pleasant,  well-nourished, in no distress,  normal affect, on O2  ENT: No lesions,  mouth clear,  oropharynx clear, no postnasal drip  Neck: No JVD, no TMG, no carotid bruits  Lungs: No use of accessory muscles, quite distant with prolonged expiration, and expiratory wheeze bilaterally  Cardiovascular: RRR, heart sounds normal, no murmur or gallops, no peripheral edema  Musculoskeletal: No deformities, no cyanosis or clubbing  Neuro: alert, non focal  Skin: Warm, no lesions or rashes      Assessment & Plan:  COPD (chronic obstructive pulmonary disease) with emphysema Please continue your inhaled Medications as you have been taking them Start using prednisone 15 mg daily every day. Do this for the next 2 weeks and then decrease to 10 mg daily every day. You need to work very hard on stopping smoking altogether. Your CT scan of the chest shows continued chronic inflammation near lungs probably from cigarette smoke Flu shot today Pneumonia shot is up-to-date Depending on your progress we may decide to perform a procedure called a bronchoscopy to evaluate your lungs further. 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 He does continue to smoke. At this time I will defer bronchoscopy although it may be necessary. I like to get him on a stable dose prednisone, taper this systematically. He will work hard on stopping smoking altogether his current exposure is minimal. Follow up with him in a month and we will determine when to reimage him at that time.  Levy Pupaobert Perrin Eddleman, MD, PhD 08/28/2016, 9:29 AM [facility] [phone] or if no answer [phone]

## 2016-08-28 NOTE — Patient Instructions (Signed)
Please continue your inhaled Medications as you have been taking them Start using prednisone 15 mg daily every day. Do this for the next 2 weeks and then decrease to 10 mg daily every day. You need to work very hard on stopping smoking altogether. Your CT scan of the chest shows continued chronic inflammation near lungs probably from cigarette smoke Flu shot today Pneumonia shot is up-to-date Depending on your progress we may decide to perform a procedure called a bronchoscopy to evaluate your lungs further. Follow with Dr Delton CoombesByrum in 1 month

## 2016-08-31 ENCOUNTER — Telehealth: Payer: Self-pay | Admitting: Emergency Medicine

## 2016-08-31 NOTE — Telephone Encounter (Signed)
mucinex dm 1200 mg  Bid and flutter if has one  For two weeks >> Try prilosec otc 20mg   Take 30-60 min before first meal of the day and Pepcid ac (famotidine) 20 mg one @  bedtime until cough is completely gone for at least a week without the need for cough suppression  Nothing else to offer over the phone

## 2016-08-31 NOTE — Telephone Encounter (Signed)
Spoke with pt, c/o chest congestion that he is unable to cough up, nonprod cough Xseveral days.   Denies fever, sinus congestion, chest pain.   Pt taking symbicort, tudorza, rescue inhaler prn, maintenance abx on MWF, and maintenance prednisone on 15mg  daily.  [facility] [address]    Pt was seen by RB on 08/28/16.  Sending to DOD as RB is unavailable this morning.  MW please advise on recs.  Thanks  Patient Instructions   Please continue your inhaled Medications as you have been taking them Start using prednisone 15 mg daily every day. Do this for the next 2 weeks and then decrease to 10 mg daily every day. You need to work very hard on stopping smoking altogether. Your CT scan of the chest shows continued chronic inflammation near lungs probably from cigarette smoke Flu shot today Pneumonia shot is up-to-date Depending on your progress we may decide to perform a procedure called a bronchoscopy to evaluate your lungs further. Follow with Dr Delton CoombesByrum in 1 month

## 2016-08-31 NOTE — Telephone Encounter (Signed)
Spoke with pt, aware of recs. Pt does not have a flutter valve.  Offered to leave one up front, but pt [place] in [place] and cannot come pick this up at this time.  Nothing further needed at this time.

## 2016-09-05 ENCOUNTER — Telehealth: Payer: Self-pay | Admitting: Emergency Medicine

## 2016-09-05 ENCOUNTER — Other Ambulatory Visit: Payer: Self-pay | Admitting: Emergency Medicine

## 2016-09-05 NOTE — Telephone Encounter (Signed)
This prescription has been sent in. Pt is aware. Nothing further was needed.

## 2016-09-07 ENCOUNTER — Other Ambulatory Visit: Payer: Self-pay | Admitting: Emergency Medicine

## 2016-09-25 ENCOUNTER — Other Ambulatory Visit: Payer: Self-pay | Admitting: Emergency Medicine

## 2016-10-02 ENCOUNTER — Ambulatory Visit: Payer: Medicaid Other | Admitting: Emergency Medicine

## 2016-10-04 ENCOUNTER — Ambulatory Visit (INDEPENDENT_AMBULATORY_CARE_PROVIDER_SITE_OTHER): Payer: Medicaid Other | Admitting: Adult Health

## 2016-10-04 ENCOUNTER — Encounter: Payer: Self-pay | Admitting: Adult Health

## 2016-10-04 ENCOUNTER — Encounter: Payer: Self-pay | Admitting: *Deleted

## 2016-10-04 VITALS — BP 138/80 | HR 104 | Temp 98.2°F | Ht 68.0 in | Wt 159.2 lb

## 2016-10-04 DIAGNOSIS — R3915 Urgency of urination: Secondary | ICD-10-CM

## 2016-10-04 MED ORDER — PREDNISONE 10 MG PO TABS: ORAL_TABLET | ORAL | 0 refills | Status: DC

## 2016-10-04 NOTE — Patient Instructions (Addendum)
Follow up with PCP for urinary issues  If not able to improve may need to stop [name] .  Continue on Symbicort 2 puffs Twice daily  , rinse after use.  Increase Prednisone 40mg  daily 5 days then 30mg  daily x 5 days , then 20mg  daily for  5 days then alternate 20mg  and 10mg  daily  follow up Dr. Delton CoombesByrum in 4-6 weeks and As needed   Please contact office for sooner follow up if symptoms do not improve or worsen or seek emergency care

## 2016-10-04 NOTE — Progress Notes (Signed)
Subjective:    Patient ID: Brent Oneal, male    DOB: [birth year], 50 y.o.   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 He says he continues to have more breathing issues. Gets winded easier with activity for last 6 months  . Dry cough most days. Still smoking. Discussed cessation he remains on tudorza and symbicort.  No fever, discolored mucus, edema or hemoptysis .  Feels more urinary urgency /frequecy  . No dysuria . This has been going on for months .  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 Needs note to say no urine drug test, on drug swabs.  Flu, Prevnar are utd.  Remains on o2 at 3l/m  On chronic steroids 10 to 20mg  daily .   Past Medical History:  Diagnosis Date  . Allergic   . COPD (chronic obstructive pulmonary disease) (HCC)   . Hyperlipidemia   . Hypertension    Current Outpatient Prescriptions on File Prior to Visit  Medication Sig Dispense Refill  . Ascorbic Acid (VITAMIN C) 1000 MG tablet Take 1,000 mg by mouth daily.    Marland Kitchen. azithromycin (ZITHROMAX) 250 MG tablet TAKE 1 TABLET BY MOUTH ON MONDAY,WEDNESDAY, AND FRIDAY 12 tablet 0  . METHADONE HCL PO Take 145 mg by mouth daily. Takes throughout the day.    . Potassium Gluconate 595 MG CAPS Take 1 capsule by mouth daily.    . predniSONE (DELTASONE) 10 MG tablet TAKE 1 OR 2 TABLTS BY MOUTH DAILY FOR MAINTENANCE 60 tablet 5  . PROAIR HFA 108 (90 Base) MCG/ACT inhaler INHALE 2 PUFFS INTO THE LUNGS EVERY 6 HOURS AS NEEDED FOR BREATHING 8.5 Inhaler 5  . SYMBICORT 160-4.5 MCG/ACT inhaler INHALE 2 PUFFS INTO LUNGS 2 TIMES DAILY 10.2 Inhaler 5  . TUDORZA PRESSAIR 400 MCG/ACT AEPB INHALE 1 PUFF INTO THE LUNGS 2 TIMES DAILY. 1 each 5   No current facility-administered medications on file prior to visit.       Review of Systems Constitutional:   No  weight loss, night sweats,   Fevers, chills,  +fatigue, or  lassitude.  HEENT:   No headaches,  Difficulty swallowing,  Tooth/dental problems, or  Sore throat,                No sneezing, itching, ear ache, nasal congestion, post nasal drip,   CV:  No chest pain,  Orthopnea, PND, swelling in lower extremities, anasarca, dizziness, palpitations, syncope.   GI  No heartburn, indigestion, abdominal pain, nausea, vomiting, diarrhea, change in bowel habits, loss of appetite, bloody stools.   Resp:    No chest wall deformity  Skin: no rash or lesions.  GU: no dysuria, change in color of urine, + urgency /frequency.  No flank pain, no hematuria   MS:  No joint pain or swelling.  No decreased range of motion.  No back pain.  Psych:  No change in mood or affect. No depression or anxiety.  No memory loss.         Objective:   Physical Exam Vitals:   10/04/16 1006  BP: 138/80  Pulse: (!) 104  Temp: 98.2 F (36.8 C)  TempSrc: Oral  SpO2: 92%  Weight: 159 lb 3.2 oz (72.2 kg)  Height: 5\' 8"  (1.727 m)   GEN: A/Ox3; pleasant , NAD,  thin and chronically ill appearing    HEENT:  [place]/AT,  EACs-clear, TMs-wnl, NOSE-clear, THROAT-clear, no lesions, no postnasal drip or exudate noted.   NECK:  Supple w/ fair ROM; no JVD; normal carotid impulses w/o bruits; no thyromegaly or nodules palpated; no lymphadenopathy.    RESP  Decreased BS in bases ,  no accessory muscle use, no dullness to percussion  CARD:  RRR, no m/r/g  , no peripheral edema, pulses intact, no cyanosis or clubbing.  GI:   Soft & nt; nml bowel sounds; no organomegaly or masses detected.   Musco: Warm bil, no deformities or joint swelling noted.   Neuro: alert, no focal deficits noted.    Skin: Warm, no lesions or rashes  Burley Kopka NP-C  [facility]  10/04/2016        Assessment & Plan:

## 2016-10-04 NOTE — Assessment & Plan Note (Signed)
Cont on O2   Plan  Patient Instructions  Follow up with PCP for urinary issues  If not able to improve may need to stop [name] .  Continue on Symbicort 2 puffs Twice daily  , rinse after use.  Increase Prednisone 40mg  daily 5 days then 30mg  daily x 5 days , then 20mg  daily for  5 days then alternate 20mg  and 10mg  daily  follow up Dr. Delton CoombesByrum in 4-6 weeks and As needed   Please contact office for sooner follow up if symptoms do not improve or worsen or seek emergency care

## 2016-10-04 NOTE — Assessment & Plan Note (Signed)
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  Continue on Symbicort 2 puffs Twice daily  , rinse after use.  Increase Prednisone 40mg  daily 5 days then 30mg  daily x 5 days , then 20mg  daily for  5 days then alternate 20mg  and 10mg  daily  follow up Dr. Delton CoombesByrum in 4-6 weeks and As needed   Please contact office for sooner follow up if symptoms do not improve or worsen or seek emergency care

## 2016-10-15 ENCOUNTER — Other Ambulatory Visit: Payer: Self-pay | Admitting: Adult Health

## 2016-10-17 ENCOUNTER — Telehealth: Payer: Self-pay | Admitting: Emergency Medicine

## 2016-10-17 MED ORDER — AZITHROMYCIN 250 MG PO TABS: ORAL_TABLET | ORAL | 2 refills | Status: DC

## 2016-10-17 NOTE — Telephone Encounter (Signed)
Spoke with the pt and notified that his rx was sent  He verbalized understanding and nothing further needed

## 2016-10-17 NOTE — Addendum Note (Signed)
Addended by: Christen ButterASKIN, Yalda Herd M on: 10/17/2016 05:23 PM   Modules accepted: Orders

## 2016-11-02 ENCOUNTER — Encounter: Payer: Self-pay | Admitting: Emergency Medicine

## 2016-11-02 ENCOUNTER — Ambulatory Visit (INDEPENDENT_AMBULATORY_CARE_PROVIDER_SITE_OTHER): Payer: Medicaid Other | Admitting: Emergency Medicine

## 2016-11-02 DIAGNOSIS — J84115 Respiratory bronchiolitis interstitial lung disease: Secondary | ICD-10-CM

## 2016-11-02 DIAGNOSIS — J438 Other emphysema: Secondary | ICD-10-CM

## 2016-11-02 NOTE — Assessment & Plan Note (Signed)
Severe emphysema with hypoxemic failure. Not exacerbated currently. He is on prednisone at a moderately high-dose every day. 4. He wonders if he may at some point be a transplant candidate. This may be the case especially if he can stay off cigarettes but I wonder whether he has the social support to be a great candidate. He tells me that he stop smoking 1 month ago. Flu shot and pneumonia shots are up-to-date.  Please continue your inhaled medications as you are taking them  Continue oxygen at 3L/min Take albuterol 2 puffs up to every 4 hours if needed for shortness of breath.  Continue your prednisone as ordered. If you are able to stay off cigarettes then we will discuss beginning to decrease the prednisone next time.  We will repeat your CT chest next Fall to look for interval improvement.  Follow with Dr Delton CoombesByrum in 6 months or sooner if you have any problems

## 2016-11-02 NOTE — Patient Instructions (Addendum)
Please continue your inhaled medications as you are taking them  Continue oxygen at 3L/min Take albuterol 2 puffs up to every 4 hours if needed for shortness of breath.  Continue your prednisone as ordered. If you are able to stay off cigarettes then we will discuss beginning to decrease the prednisone next time.  We will repeat your CT chest next Fall to look for interval improvement.  Follow with Dr Delton CoombesByrum in 6 months or sooner if you have any problems

## 2016-11-02 NOTE — Assessment & Plan Note (Signed)
Will assess for improvement if he can stay off cigarettes. Repeat Ct chest in fall 2018. Sooner if he has new problems.

## 2016-11-02 NOTE — Progress Notes (Signed)
Subjective:    Patient ID: Brent Oneal, male    DOB: 02/14/1966, 50 y.o.   MRN: [id number]  HPI 50 year old smoker who has been followed by Dr. Shelle Ironlance in our office for emphysematous COPD, chronic hypoxemia, and a left upper lobe groundglass pulmonary nodules CT scan of the chest. His most recent CT was 08/03/15 the personal or reviewed. Showed some resolution of prominent left upper lobe groundglass opacity but persistence of a smaller left upper lobe and left lower lobe patchy area of groundglass. Also noted was diffuse bronchial wall thickening and emphysematous change consistent with severe COPD. Is a1 antitrypsin was 166 g/dL 4 years ago, genotype MM.  He had a URI last month, caused dyspnea and bronchitis. [facility], was treated w abx. He is on azithro TIW chronically. On symbicort and tudorza. No chronic pred. Last pred was probably by his PCP few months ago - he isn't sure.  He is smoking about 3 cig a week. He doesn;t have a plan to stop - discussed today. He uses SABA several times a week.   ROV 08/28/16 --  50 year old man with history of emphysematous COPD, associated chronic hypoxemic resp failure, groundglass pulmonary nodules and been followed with serial CT scans of the chest. Since last visit he has been treated with prednisone for symptoms  Most recent CT scan of the chest 08/02/16 was reviewed by me. This shows mild patchy bilateral ground glass micronodular disease. 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 His last CT scan of the chest was done on 08/02/16. He was seen in our office on 10/04/16 and treated for an acute exacerbation with prednisone taper back down to his maintenance dose of 20 mg alternated with 10 mg daily. Also on azithromycin 3x a week. Currently managed on tudorza and  symbicort. Alpha-1 antitrypsin genotype is normal. Oxygen at 3-4L/min.   Reports increased SOB, some days has chest tightness. He is having periods of anxiety. He uses ProAir about . He tells me that he quit cigarettes since his last visit here - but he cannot really tell me the exact time that he quit.    Review of Systems Per HPI  Past Medical History:  Diagnosis Date  . Allergic   . COPD (chronic obstructive pulmonary disease) (HCC)   . Hyperlipidemia   . Hypertension      Family History  Problem Relation Age of Onset  . Emphysema Father   . Allergies Maternal Grandmother   . Allergies Maternal Grandfather   . Asthma Father   . Heart attack Father   . Bone cancer Mother      Social History   Social History  . Marital status: Legally Separated    Spouse name: N/A  . Number of children: N/A  . Years of education: N/A   Occupational History  . disabled.     Social History Main Topics  . Smoking status: Former Smoker    Packs/day: 0.25    Years: 30.00    Types: Cigarettes    Quit date: 08/28/2016  . Smokeless tobacco: Never Used     Comment: using vapor, 3 cigarettes daily  . Alcohol use No  . Drug use: No  . Sexual activity: Not on file   Other Topics Concern  . Not on file   Social History  Narrative   Lives with house mates.           Allergies  Allergen Reactions  . Amitriptyline Other (See Comments)    Blacked out Went crazy-did not know what was going on.  . Celecoxib Rash     Outpatient Medications Prior to Visit  Medication Sig Dispense Refill  . Ascorbic Acid (VITAMIN C) 1000 MG tablet Take 1,000 mg by mouth daily.    Marland Kitchen. azithromycin (ZITHROMAX) 250 MG tablet TAKE 1 TABLET BY MOUTH ON MONDAY,WEDNESDAY, AND FRIDAY 12 tablet 2  . METHADONE HCL PO Take 145 mg by mouth daily. Takes throughout the day.    . Potassium Gluconate 595 MG CAPS Take 1 capsule by mouth daily.    . predniSONE (DELTASONE) 10 MG tablet TAKE 1 OR 2 TABLTS BY MOUTH DAILY  FOR MAINTENANCE 60 tablet 5  . PROAIR HFA 108 (90 Base) MCG/ACT inhaler INHALE 2 PUFFS INTO THE LUNGS EVERY 6 HOURS AS NEEDED FOR BREATHING 8.5 Inhaler 5  . SYMBICORT 160-4.5 MCG/ACT inhaler INHALE 2 PUFFS INTO LUNGS 2 TIMES DAILY 10.2 Inhaler 5  . TUDORZA PRESSAIR 400 MCG/ACT AEPB INHALE 1 PUFF INTO THE LUNGS 2 TIMES DAILY. 1 each 5  . predniSONE (DELTASONE) 5 MG tablet TAKE 8 TABS FOR 5 DAYS, 6 TABS FOR 5 DAYS, 4 TABS FOR 5 DAYS, THEN ALTERNATE 4 TABS AND 2 TAB DAILY 100 tablet 0   No facility-administered medications prior to visit.       Objective:   Physical Exam Vitals:   11/02/16 0857  BP: (!) 152/90  Pulse: 100  SpO2: 95%  Weight: 161 lb (73 kg)  Height: 5\' 8"  (1.727 m)   Gen: Pleasant, well-nourished, in no distress,  normal affect, on O2  ENT: No lesions,  mouth clear,  oropharynx clear, no postnasal drip  Neck: No JVD, no TMG, no carotid bruits  Lungs: No use of accessory muscles, quite distant with prolonged expiration, and expiratory wheeze bilaterally  Cardiovascular: RRR, heart sounds normal, no murmur or gallops, no peripheral edema  Musculoskeletal: No deformities, no cyanosis or clubbing  Neuro: alert, non focal  Skin: Warm, no lesions or rashes      Assessment & Plan:  COPD (chronic obstructive pulmonary disease) with emphysema Severe emphysema with hypoxemic failure. Not exacerbated currently. He is on prednisone at a moderately high-dose every day. 4. He wonders if he may at some point be a transplant candidate. This may be the case especially if he can stay off cigarettes but I wonder whether he has the social support to be a great candidate. He tells me that he stop smoking 1 month ago. Flu shot and pneumonia shots are up-to-date.  Please continue your inhaled medications as you are taking them  Continue oxygen at 3L/min Take albuterol 2 puffs up to every 4 hours if needed for shortness of breath.  Continue your prednisone as ordered. If you are  able to stay off cigarettes then we will discuss beginning to decrease the prednisone next time.  We will repeat your CT chest next Fall to look for interval improvement.  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 Repeat Ct chest in fall 2018. Sooner if he has new problems.   Levy Pupaobert Starlynn Klinkner, MD, PhD 11/02/2016, 9:25 AM [facility] [phone] or if no answer [phone]

## 2016-11-06 ENCOUNTER — Other Ambulatory Visit: Payer: Self-pay | Admitting: Emergency Medicine

## 2016-11-12 ENCOUNTER — Other Ambulatory Visit: Payer: Self-pay | Admitting: Emergency Medicine

## 2016-11-13 ENCOUNTER — Other Ambulatory Visit: Payer: Self-pay | Admitting: Emergency Medicine

## 2016-11-16 ENCOUNTER — Telehealth: Payer: Self-pay | Admitting: Pulmonary Disease

## 2016-11-16 ENCOUNTER — Telehealth: Payer: Self-pay | Admitting: Emergency Medicine

## 2016-11-16 MED ORDER — BUDESONIDE-FORMOTEROL FUMARATE 160-4.5 MCG/ACT IN AERO: 2.0000 | INHALATION_SPRAY | Freq: Two times a day (BID) | RESPIRATORY_TRACT | 5 refills | Status: DC

## 2016-11-16 NOTE — Telephone Encounter (Signed)
Pt thinks he is coming down with a cold [phone]

## 2016-11-16 NOTE — Telephone Encounter (Signed)
Called and spoke with pt and he stated that he feels like he is coming down with a cold or URI.  He stated that he is having chest and head congestion that is still clear but very thick.  He has been taking tylenol to help with the low grade fevers that he has been having since last night.  Pt stated that he did not want to use up his refills for the abx that he has on file.  Pt also requested a refill of the symbicort, but this has been sent to the pharmacy.  RB please advise. Pt was last seen on 11/02/16  Allergies  Allergen Reactions  . Amitriptyline Other (See Comments)    Blacked out Went crazy-did not know what was going on.  . Celecoxib Rash

## 2016-11-16 NOTE — Telephone Encounter (Signed)
Returned [employer] call. Aneta Minshillip relates that Dr. Delton CoombesByrum has already instructed him what to do.

## 2016-11-16 NOTE — Telephone Encounter (Signed)
Spoke with the pt and notified of recs per RB  He verbalized understanding  Symbicort already refilled  Pt to call for ov next wk if not improving

## 2016-11-16 NOTE — Telephone Encounter (Signed)
Please have him start mucinex 600mg  BID if he is not already on this.  Have him use OTC decongestant like tylenol cold & flu or theraflu for symptom management.  Let him know that if he develops colored mucous, more mucous / cough then he needs to start the antibiotics.  If he develops wheezing or SOB then he needs to call us back to let us know.

## 2016-11-30 ENCOUNTER — Telehealth: Payer: Self-pay | Admitting: Emergency Medicine

## 2016-11-30 MED ORDER — PREDNISONE 20 MG PO TABS: 20.0000 mg | ORAL_TABLET | Freq: Every day | ORAL | 0 refills | Status: DC

## 2016-11-30 MED ORDER — LEVOFLOXACIN 500 MG PO TABS: 500.0000 mg | ORAL_TABLET | Freq: Every day | ORAL | 0 refills | Status: DC

## 2016-11-30 NOTE — Telephone Encounter (Signed)
Sending to DOD to address. AD please advise.  Thanks!  

## 2016-11-30 NOTE — Telephone Encounter (Signed)
Spoke with pt, aware of recs.  rx's sent to preferred pharmacy.  Nothing further needed.  

## 2016-11-30 NOTE — Telephone Encounter (Signed)
Please prescribed prednisone, 20 mg a day daily for 7 days. Please prescribed levofloxacin, 500 mg a day daily for 7 days. If he's not better over the weekend, the limb to go to the emergency room.  Pollie MeyerJ. Angelo A de Dios, MD 11/30/2016, 5:11 PM [facility] Pager [phone] After 3 pm or if no answer, call [phone]

## 2016-11-30 NOTE — Telephone Encounter (Signed)
Spoke with the pt  He states that he has started to cough up light yellow sputum over the past few days  He is still feeling increased SOB and wheezing  He had called on 11/16/16 and we advised that he try mucinex and otc cold/flu meds  He has been taking this without any relief  Wants pred taper and wonders if he needs abx  Allergies  Allergen Reactions  . Amitriptyline Other (See Comments)    Blacked out Went crazy-did not know what was going on.  . Celecoxib Rash

## 2017-01-28 ENCOUNTER — Telehealth: Payer: Self-pay | Admitting: Emergency Medicine

## 2017-01-28 NOTE — Telephone Encounter (Signed)
Spoke with Lorrin JacksonNicolette Brown at [facility], states she went to pt's home for a wound care visit and noticed that his 02 was 4-5lpm instead of the prescribed 3lpm.  Nicolette turned down his 02 to 3lpm and was staying at 89%-90%.  Per last ov note pt was to wear 3lpm.  atc pt to check current status- line rang several times with no answer, no vm.  Wcb.

## 2017-01-29 NOTE — Telephone Encounter (Signed)
He needs to be seen for an acute OV.

## 2017-01-29 NOTE — Telephone Encounter (Signed)
lmtcb X1 for pt  

## 2017-01-29 NOTE — Telephone Encounter (Signed)
Spoke with pt, who states he recently had leg surgery (due to a recent fall). Pt reports of increased increased sob, prod cough white mucus and occ wheezing since surgery. Pt states he has had to increase his liter flow to 6L due to SOB X 38mo  RB please advise. Thanks.   Allergies  Allergen Reactions  . Amitriptyline Other (See Comments)    Blacked out Went crazy-did not know what was going on.  . Celecoxib Rash

## 2017-01-30 ENCOUNTER — Other Ambulatory Visit: Payer: Self-pay | Admitting: Emergency Medicine

## 2017-01-30 ENCOUNTER — Telehealth: Payer: Self-pay | Admitting: Emergency Medicine

## 2017-01-30 NOTE — Telephone Encounter (Signed)
Patient called advised RB wants patient to be seen. Patient states that he has to set up transportation and he needs to give 5 day notice - pr

## 2017-01-30 NOTE — Telephone Encounter (Signed)
Spoke with pt, expressed concern about his ambulatory 02 lasting through his appt on Monday.  I advised pt that we can place him on our 02 when arrives.  Pt expressed understanding.  Nothing further needed.

## 2017-01-30 NOTE — Telephone Encounter (Signed)
Called and spoke to pt. Informed him of the recs per RB. Pt verbalized understanding and denied any further questions or concerns at this time.   

## 2017-01-30 NOTE — Telephone Encounter (Signed)
If this is the best he / we can do then I would set it up. If he becomes acute ill or decompensates then he would need to be transported by EMS to the ED.

## 2017-01-30 NOTE — Telephone Encounter (Signed)
He is supposed to be on 20mg  once a day. I don't recall telling him to use or to increase prn. If he has been taking 40mg  daily and is worse, then unclear to me how much he truly need to be on. This will need to be clarified at his OV.   For now > 30mg  daily until he sees TP. Goal to wean as able once we see him

## 2017-01-30 NOTE — Telephone Encounter (Signed)
x

## 2017-01-30 NOTE — Telephone Encounter (Signed)
[facility] appt scheduled for Monday 3.5.18 @ 1015 w/ TP Called spoke with patient to provide appt info - pt okay with this date and time and voiced his understanding to see emergency attention if his symptoms worsen prior to ov.    Pt did ask for more prednisone to last until his appt, stated that he has 2 days' worth left.  On 12.13.17 pt was given #100 tabs of Prednisone 5mg  with directions to take 1-2 tabs daily.  Pt stated that he is taking prednisone 10mg  2 tabs BID.  This was verified twice with patient.  RB please advise if you would like pt to stay at the increased dose until ov or back to his regular dosing - will be happy to send Rx.  Thank you.

## 2017-01-30 NOTE — Telephone Encounter (Signed)
Noted by triage. Dr Delton CoombesByrum, pt stated he is unable to come in for ov in less than 5 days because he has to [facility].  Pt stated that he is taken somewhere every morning at 6.30-7am and is also concerned with having enough O2 away from home.  Did ask patient if he had any friends or family that would be able to bring him for appt and he stated "not close."  Pt provided transportation number to see if something can be coordinated >> [phone].   [facility], spoke with Tish who reported the earliest day they can bring patient for appt is Monday March 5 but this would need to be scheduled TODAY, otherwise it will be Tuesday or later.  Dr Delton CoombesByrum please advise if Monday or later is okay for pt to come for appt (there are limited openings with TP only).  Thank you.

## 2017-01-30 NOTE — Telephone Encounter (Signed)
lmtcb x2 for pt. 

## 2017-01-31 ENCOUNTER — Telehealth: Payer: Self-pay | Admitting: Emergency Medicine

## 2017-01-31 DIAGNOSIS — J438 Other emphysema: Secondary | ICD-10-CM

## 2017-01-31 NOTE — Telephone Encounter (Signed)
Pt is requesting an [facility] be [facility] to [facility] for Pulse Oximeter.  Aware that normally insurance does not cover these devices because you can buy them OTC at local pharmacy or [facility]. Nothing further needed.

## 2017-02-04 ENCOUNTER — Encounter: Payer: Self-pay | Admitting: Adult Health

## 2017-02-04 ENCOUNTER — Ambulatory Visit (INDEPENDENT_AMBULATORY_CARE_PROVIDER_SITE_OTHER)
Admission: RE | Admit: 2017-02-04 | Discharge: 2017-02-04 | Disposition: A | Discharge status: Home / Self Care | Payer: Medicaid Other | Source: Ambulatory Visit | Attending: Adult Health | Admitting: Adult Health

## 2017-02-04 ENCOUNTER — Ambulatory Visit (INDEPENDENT_AMBULATORY_CARE_PROVIDER_SITE_OTHER): Payer: Medicaid Other | Admitting: Adult Health

## 2017-02-04 ENCOUNTER — Other Ambulatory Visit (INDEPENDENT_AMBULATORY_CARE_PROVIDER_SITE_OTHER): Payer: Medicaid Other

## 2017-02-04 DIAGNOSIS — J439 Emphysema, unspecified: Secondary | ICD-10-CM

## 2017-02-04 DIAGNOSIS — J84115 Respiratory bronchiolitis interstitial lung disease: Secondary | ICD-10-CM

## 2017-02-04 DIAGNOSIS — J9611 Chronic respiratory failure with hypoxia: Secondary | ICD-10-CM

## 2017-02-04 LAB — CBC WITH DIFFERENTIAL/PLATELET
BASOS ABS: 0.1 10*3/uL (ref 0.0–0.1)
Basophils Relative: 0.5 % (ref 0.0–3.0)
EOS ABS: 0.1 10*3/uL (ref 0.0–0.7)
Eosinophils Relative: 0.8 % (ref 0.0–5.0)
HEMATOCRIT: 39.7 % (ref 39.0–52.0)
HEMOGLOBIN: 12.8 g/dL — AB (ref 13.0–17.0)
LYMPHS PCT: 11.9 % — AB (ref 12.0–46.0)
Lymphs Abs: 1.6 10*3/uL (ref 0.7–4.0)
MCHC: 32.2 g/dL (ref 30.0–36.0)
MCV: 85.3 fl (ref 78.0–100.0)
MONOS PCT: 6.9 % (ref 3.0–12.0)
Monocytes Absolute: 0.9 10*3/uL (ref 0.1–1.0)
NEUTROS ABS: 10.8 10*3/uL — AB (ref 1.4–7.7)
Neutrophils Relative %: 79.9 % — ABNORMAL HIGH (ref 43.0–77.0)
PLATELETS: 253 10*3/uL (ref 150.0–400.0)
RBC: 4.65 Mil/uL (ref 4.22–5.81)
RDW: 14.8 % (ref 11.5–15.5)
WBC: 13.5 10*3/uL — AB (ref 4.0–10.5)

## 2017-02-04 LAB — BASIC METABOLIC PANEL
BUN: 19 mg/dL (ref 6–23)
CHLORIDE: 96 meq/L (ref 96–112)
CO2: 40 meq/L — AB (ref 19–32)
CREATININE: 0.78 mg/dL (ref 0.40–1.50)
Calcium: 9.3 mg/dL (ref 8.4–10.5)
GFR: 111.88 mL/min (ref >60.00)
GLUCOSE: 112 mg/dL — AB (ref 70–99)
POTASSIUM: 4.4 meq/L (ref 3.5–5.1)
Sodium: 142 mEq/L (ref 135–145)

## 2017-02-04 LAB — D-DIMER, QUANTITATIVE: D-Dimer, Quant: 0.82 mcg/mL FEU — ABNORMAL HIGH (ref <0.50)

## 2017-02-04 LAB — BRAIN NATRIURETIC PEPTIDE: Pro B Natriuretic peptide (BNP): 17 pg/mL (ref 0.0–100.0)

## 2017-02-04 NOTE — Assessment & Plan Note (Signed)
?  flare vs progressive decline in smoker  Recent leg injury - check D Dimer , if positive will CTa Chest . Check CXR today .   Plan  . Patient Instructions  Chest xray today  Labs today  Continue on Symbicort and Tudorza .  Stay on prednisone 10mg  Twice daily  For now .  Follow up Dr. Delton CoombesByrum  In 4-6 weeks and As needed   Please contact office for sooner follow up if symptoms do not improve or worsen or seek emergency care

## 2017-02-04 NOTE — Assessment & Plan Note (Signed)
Doubt flare  Check cxr  Hold on current prednisone dose.  follow up 4 weeks

## 2017-02-04 NOTE — Progress Notes (Signed)
Called spoke with patient, advised of lab results / recs as stated by TP.  Pt verbalized his understanding and denied any questions.  Will call patient back after BMET results are received

## 2017-02-04 NOTE — Patient Instructions (Signed)
Chest xray today  Labs today  Continue on Symbicort and Tudorza .  Stay on prednisone 10mg  Twice daily  For now .  Follow up Dr. Delton CoombesByrum  In 4-6 weeks and As needed   Please contact office for sooner follow up if symptoms do not improve or worsen or seek emergency care

## 2017-02-04 NOTE — Addendum Note (Signed)
Addended by: Boone MasterJONES, Angeldejesus Callaham E on: 02/04/2017 11:09 AM   Modules accepted: Orders

## 2017-02-04 NOTE — Assessment & Plan Note (Signed)
Cont on o2 .  

## 2017-02-04 NOTE — Progress Notes (Signed)
@Patient  ID: Brent Oneal, male    DOB: 02-25-1966, 51 y.o.   MRN: [id number]  Chief Complaint  Patient presents with  . Acute Visit    cough     Referring provider: No ref. provider found  HPI: 51 year old male smoker followed for emphysematous COPD, chronic, chronic hypoxemia on oxygen and left upper lobe ground glass pulmonary nodules on CT chest. 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 Steroid dependent   TEST  Alpha 1 MM , 166 .  CT chest 07/2016 >Mild patchy ground-glass centrilobular micronodularity throughout both lungs, mildly worsened. In a symptomatic current smoker, this finding is most consistent with respiratory bronchiolitis-interstitial lung disease (RB-ILD). No significant traction bronchiectasis or frank honeycombing. 2. Moderate centrilobular emphysema with mild diffuse bronchial wall thickening, consistent with the provided history of COPD.  02/04/2017 Acute OV  ; COPD /O2 RF  Patient presents for an acute office visit. He complains over last 2 weeks of increased shortness of breath. Gets winded easily . No hemoptysis , fever, calf pain or edema.  Minimal cough  Or wheezing . Just feels he does not have as much breath.  On chronic steroids , Prednisone 10mg  Twice daily  . (this is lowest dose for last few months) . On Symbicort and Tudorza .   On Oxygen 3l/m .  Pt had left lower leg injury requiring debridement and currently has wound Vac. This happened about 1 month ago. Following [facility] . No increased leg swelling .    Chronic pain on methadone .    Allergies  Allergen Reactions  . Amitriptyline Other (See Comments)    Blacked out Went crazy-did not know what was going on.  . Celecoxib Rash    Immunization History  Administered Date(s) Administered  . Influenza Split 10/02/2011, 10/26/2013  . Influenza, Seasonal, Injecte, Preservative Fre 12/22/2014  . Influenza,inj,Quad PF,36+ Mos 08/28/2016  . Pneumococcal Conjugate-13 12/22/2014  . Td 05/10/2005    Past  Medical History:  Diagnosis Date  . Allergic   . COPD (chronic obstructive pulmonary disease) (HCC)   . Hyperlipidemia   . Hypertension     Tobacco History: History  Smoking Status  . Former Smoker  . Packs/day: 0.25  . Years: 30.00  . Types: Cigarettes  . Quit date: 08/28/2016  Smokeless Tobacco  . Never Used    Comment: using vapor, 3 cigarettes daily   Counseling given: Not Answered   Outpatient Encounter Prescriptions as of 02/04/2017  Medication Sig  . Ascorbic Acid (VITAMIN C) 1000 MG tablet Take 1,000 mg by mouth daily.  Marland Kitchen azithromycin (ZITHROMAX) 250 MG tablet TAKE 1 TABLET BY MOUTH ON MONDAY,WEDNESDAY, AND FRIDAY  . budesonide-formoterol (SYMBICORT) 160-4.5 MCG/ACT inhaler Inhale 2 puffs into the lungs 2 (two) times daily.  Marland Kitchen METHADONE HCL PO Take 145 mg by mouth daily. Takes throughout the day.  . Potassium Gluconate 595 MG CAPS Take 1 capsule by mouth daily.  . predniSONE (DELTASONE) 10 MG tablet TAKE 1 OR 2 TABLTS BY MOUTH DAILY FOR MAINTENANCE  . PROAIR HFA 108 (90 Base) MCG/ACT inhaler INHALE 2 PUFFS INTO THE LUNGS EVERY 6 HOURS AS NEEDED FOR BREATHING  . TUDORZA PRESSAIR 400 MCG/ACT AEPB INHALE 1 PUFF BY MOUTH INTO THE LUNGS 2 TIMES DAILY.  Marland Kitchen predniSONE (DELTASONE) 20 MG tablet Take 1 tablet (20 mg total) by mouth daily with breakfast. (Patient not taking: Reported on 02/04/2017)  . predniSONE (DELTASONE) 5 MG tablet 1-2 tabs daily (Patient not taking: Reported on 02/04/2017)  . [DISCONTINUED]  levofloxacin (LEVAQUIN) 500 MG tablet Take 1 tablet (500 mg total) by mouth daily. (Patient not taking: Reported on 02/04/2017)   No facility-administered encounter medications on file as of 02/04/2017.      Review of Systems  Constitutional:   No  weight loss, night sweats,  Fevers, chills, + fatigue, or  lassitude.  HEENT:   No headaches,  Difficulty swallowing,  Tooth/dental problems, or  Sore throat,                No sneezing, itching, ear ache,  +nasal congestion, post  nasal drip,   CV:  No chest pain,  Orthopnea, PND,, anasarca, dizziness, palpitations, syncope.   GI  No heartburn, indigestion, abdominal pain, nausea, vomiting, diarrhea, change in bowel habits, loss of appetite, bloody stools.   Resp:  .  No chest wall deformity  Skin: no rash or lesions.  GU: no dysuria, change in color of urine, no urgency or frequency.  No flank pain, no hematuria   MS:  No joint pain or swelling.  No decreased range of motion.  No back pain.    Physical Exam  BP 124/76 (BP Location: Left Arm, Cuff Size: Normal)   Pulse 99   Ht 5\' 8"  (1.727 m)   Wt 155 lb 9.6 oz (70.6 kg)   SpO2 95%   BMI 23.66 kg/m   GEN: A/Ox3; pleasant , NAD , chronically ill appearing on o2    HEENT:  [place]/AT,  EACs-clear, TMs-wnl, NOSE-clear, THROAT-clear, no lesions, no postnasal drip or exudate noted.   NECK:  Supple w/ fair ROM; no JVD; normal carotid impulses w/o bruits; no thyromegaly or nodules palpated; no lymphadenopathy.    RESP  Decreased BS in bases  no accessory muscle use, no dullness to percussion  CARD:  RRR, no m/r/g, tr  peripheral edema, pulses intact, no cyanosis or clubbing.  GI:   Soft & nt; nml bowel sounds; no organomegaly or masses detected.   Musco: Warm bil, no deformities or joint swelling noted.  Along LLE , wound vac in place    Neuro: alert, no focal deficits noted.    Skin: Warm, no lesions or rashes  Lab Results:  BMET   BNP No results found for: BNP  ProBNP  Imaging: No results found.   Assessment & Plan:   COPD (chronic obstructive pulmonary disease) with emphysema ?flare vs progressive decline in smoker  Recent leg injury - check D Dimer , if positive will CTa Chest . Check CXR today .   Plan  . Patient Instructions  Chest xray today  Labs today  Continue on Symbicort and Tudorza .  Stay on prednisone 10mg  Twice daily  For now .  Follow up Dr. 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  follow up 4 weeks   Chronic respiratory failure Cont on o2 .      Rubye Oaksammy Kelten Enochs, NP 02/04/2017

## 2017-02-05 ENCOUNTER — Other Ambulatory Visit: Payer: Self-pay | Admitting: Adult Health

## 2017-02-05 DIAGNOSIS — R0600 Dyspnea, unspecified: Secondary | ICD-10-CM

## 2017-02-06 ENCOUNTER — Other Ambulatory Visit: Payer: Self-pay | Admitting: Emergency Medicine

## 2017-02-06 ENCOUNTER — Inpatient Hospital Stay: Admission: RE | Admit: 2017-02-06 | Payer: Medicaid Other | Source: Ambulatory Visit

## 2017-02-06 ENCOUNTER — Telehealth: Payer: Self-pay | Admitting: Adult Health

## 2017-02-06 NOTE — Telephone Encounter (Signed)
We actually spoke with patient twice regarding d-dimer results and the need for a CT Angio: Notes Recorded by Marcellus Scottonnie L Adkins, CMA on 02/05/2017 at 3:18 PM EST Called and spoke with pt and he is aware of results and order placed for the ct scan. ------  Notes Recorded by Julio Sicksammy S Parrett, NP on 02/05/2017 at 1:55 PM EST D Dimer is elevatefd  Would proceed with CTa CHest PE protocol to make sure no PE.  Labs shows chronic COPD w/ elevated Bicarb.  Cont w/ ov recs Please contact office for sooner follow up if symptoms do not improve or worsen or seek emergency care   ------  Notes Recorded by Lowell BoutonJessica E Lesly Joslyn, CMA on 02/04/2017 at 4:30 PM EST Called spoke with patient, advised of lab results / recs as stated by TP.  Pt verbalized his understanding and denied any questions.  Will call patient back after BMET results are received  ------  Notes Recorded by Julio Sicksammy S Parrett, NP on 02/04/2017 at 2:29 PM EST D Dimer is slightly elevated, will need to set up CTa Chest -  Need BMET results before can decide if able to do this

## 2017-02-06 NOTE — Telephone Encounter (Signed)
LMOM TCB x1 to discuss with patient He used Medicaid transportation and that ypically requires 3-5 days' notice Discussed with TP - he will need to go to the ER for the CT

## 2017-02-07 NOTE — Telephone Encounter (Signed)
Called spoke with patient and discussed TP's recommendations to go to the ER for immediate evaluation and CT Angio for the positive d-dimer.  Pt voiced his understanding and reported that [facility].  Did advise pt that if [facility] to please have them send the images on a disc to Rubye Oaksammy Parrett NP as we are unable to view images done at that facility.  Pt voiced his understanding.  Also provided patient with d-dimer results and his BUN and Cr were WNL for the CT.    Nothing further needed at this time; will sign off

## 2017-02-18 ENCOUNTER — Telehealth: Payer: Self-pay | Admitting: Emergency Medicine

## 2017-02-18 MED ORDER — BUDESONIDE-FORMOTEROL FUMARATE 160-4.5 MCG/ACT IN AERO: 2.0000 | INHALATION_SPRAY | Freq: Two times a day (BID) | RESPIRATORY_TRACT | 5 refills | Status: DC

## 2017-02-18 NOTE — Telephone Encounter (Signed)
Called and spoke to pt. Pt states he is using his Symbicort too frequently and needs us to call Medicaid to state he can have it filled early. Advised pt that he should only be using med as prescribed no matter what and to use his albuterol hfa as needed and if that doesn't help his breathing then to call us. Pt verbalized understanding. One Symbicort 160 sample has been left up front. Pt verbalized understanding and denied any further questions or concerns at this time.   Will send to RB as FYI.

## 2017-02-18 NOTE — Telephone Encounter (Signed)
Spoke with pt. He is needing a refill on Symbicort. Rx has been sent in. Nothing further was needed. 

## 2017-03-28 ENCOUNTER — Ambulatory Visit: Payer: Medicaid Other | Admitting: Emergency Medicine

## 2017-04-15 ENCOUNTER — Other Ambulatory Visit: Payer: Self-pay | Admitting: Emergency Medicine

## 2017-04-18 ENCOUNTER — Telehealth: Payer: Self-pay | Admitting: Emergency Medicine

## 2017-04-18 NOTE — Telephone Encounter (Signed)
Received PA request for [name]. PA started and approved through [employer] until 04/13/2018. PA #[id number]. Pharmacy is aware of approval.

## 2017-05-30 ENCOUNTER — Other Ambulatory Visit: Payer: Self-pay | Admitting: Emergency Medicine

## 2017-05-31 ENCOUNTER — Other Ambulatory Visit: Payer: Self-pay | Admitting: Emergency Medicine

## 2017-06-02 ENCOUNTER — Other Ambulatory Visit: Payer: Self-pay | Admitting: Emergency Medicine

## 2017-07-22 ENCOUNTER — Other Ambulatory Visit: Payer: Self-pay | Admitting: Emergency Medicine

## 2017-09-23 ENCOUNTER — Other Ambulatory Visit: Payer: Self-pay | Admitting: Emergency Medicine

## 2017-12-10 ENCOUNTER — Other Ambulatory Visit: Payer: Self-pay | Admitting: Emergency Medicine

## 2017-12-10 ENCOUNTER — Telehealth: Payer: Self-pay | Admitting: Emergency Medicine

## 2017-12-10 MED ORDER — BUDESONIDE-FORMOTEROL FUMARATE 160-4.5 MCG/ACT IN AERO: INHALATION_SPRAY | RESPIRATORY_TRACT | 0 refills | Status: DC

## 2017-12-10 MED ORDER — ALBUTEROL SULFATE HFA 108 (90 BASE) MCG/ACT IN AERS: INHALATION_SPRAY | RESPIRATORY_TRACT | 0 refills | Status: DC

## 2017-12-10 MED ORDER — PREDNISONE 10 MG PO TABS: ORAL_TABLET | ORAL | 0 refills | Status: DC

## 2017-12-10 MED ORDER — ACLIDINIUM BROMIDE 400 MCG/ACT IN AEPB: INHALATION_SPRAY | RESPIRATORY_TRACT | 0 refills | Status: DC

## 2017-12-10 NOTE — Telephone Encounter (Signed)
Spoke with pt, advised him that he needed an appt with RB. He made an appt with RB on 1/28/ at 11:30. I will refill one month of medication so he can have enough until his appt.   Rxs sent in. Nothing further is needed.

## 2017-12-23 ENCOUNTER — Telehealth: Payer: Self-pay | Admitting: Emergency Medicine

## 2017-12-23 ENCOUNTER — Other Ambulatory Visit: Payer: Self-pay | Admitting: Emergency Medicine

## 2017-12-23 NOTE — Telephone Encounter (Signed)
Spoke with patient [facility] [place] [facility] Per our records shown sent today  Nothing further needed

## 2017-12-31 ENCOUNTER — Ambulatory Visit: Payer: Self-pay | Admitting: Emergency Medicine

## 2018-01-20 ENCOUNTER — Other Ambulatory Visit: Payer: Self-pay | Admitting: Emergency Medicine

## 2018-01-24 ENCOUNTER — Telehealth: Payer: Self-pay | Admitting: Emergency Medicine

## 2018-01-24 ENCOUNTER — Other Ambulatory Visit: Payer: Self-pay | Admitting: Emergency Medicine

## 2018-01-24 MED ORDER — ACLIDINIUM BROMIDE 400 MCG/ACT IN AEPB: INHALATION_SPRAY | RESPIRATORY_TRACT | 0 refills | Status: DC

## 2018-01-24 NOTE — Telephone Encounter (Signed)
Spoke with pt and advised rx sent to pharmacy. Nothing further is needed.   

## 2018-01-30 ENCOUNTER — Ambulatory Visit: Payer: Self-pay | Admitting: Emergency Medicine

## 2018-02-19 ENCOUNTER — Other Ambulatory Visit: Payer: Self-pay | Admitting: Emergency Medicine

## 2018-02-23 ENCOUNTER — Other Ambulatory Visit: Payer: Self-pay | Admitting: Emergency Medicine

## 2018-02-24 ENCOUNTER — Telehealth: Payer: Self-pay | Admitting: Emergency Medicine

## 2018-02-24 MED ORDER — PREDNISONE 10 MG PO TABS: ORAL_TABLET | ORAL | 0 refills | Status: DC

## 2018-02-24 NOTE — Telephone Encounter (Signed)
lmtcb x1 for pt. He needs an appointment before refills can be given.

## 2018-02-24 NOTE — Telephone Encounter (Signed)
Pt has pending OV on 03/04/18. Rx has been sent in. Pt is aware. Nothing further was needed.

## 2018-03-04 ENCOUNTER — Ambulatory Visit: Payer: Medicaid Other | Admitting: Emergency Medicine

## 2018-03-04 ENCOUNTER — Encounter: Payer: Self-pay | Admitting: Emergency Medicine

## 2018-03-04 DIAGNOSIS — J9611 Chronic respiratory failure with hypoxia: Secondary | ICD-10-CM | POA: Diagnosis not present

## 2018-03-04 DIAGNOSIS — J84115 Respiratory bronchiolitis interstitial lung disease: Secondary | ICD-10-CM

## 2018-03-04 DIAGNOSIS — J439 Emphysema, unspecified: Secondary | ICD-10-CM

## 2018-03-04 MED ORDER — BUDESONIDE-FORMOTEROL FUMARATE 160-4.5 MCG/ACT IN AERO: INHALATION_SPRAY | RESPIRATORY_TRACT | 2 refills | Status: DC

## 2018-03-04 MED ORDER — ACLIDINIUM BROMIDE 400 MCG/ACT IN AEPB: INHALATION_SPRAY | RESPIRATORY_TRACT | 0 refills | Status: DC

## 2018-03-04 MED ORDER — PREDNISONE 10 MG PO TABS: ORAL_TABLET | ORAL | 0 refills | Status: DC

## 2018-03-04 NOTE — Assessment & Plan Note (Signed)
Continue submental oxygen as ordered

## 2018-03-04 NOTE — Assessment & Plan Note (Signed)
Based [facility] it would appear that some of his interstitial disease is improved.  He does have significant emphysematous changes as before.  We are weaning his prednisone, goal down to 10 mg daily.  I will consider repeating his imaging depending on how he is doing clinically at our next visit.

## 2018-03-04 NOTE — Patient Instructions (Signed)
Please continue Tudorza and Symbicort as you have been taking them. Start decreasing your prednisone by 10 mg every 5 days until you get to 10 mg daily.  Then stay at this dose until next visit. Continue your oxygen at 3-4 L/min. Follow in 3 months with Dr Delton CoombesByrum. At that time we will decide whether to repeat your CT chest, pulmonary function testing.

## 2018-03-04 NOTE — Assessment & Plan Note (Addendum)
Continue Tudorza, Symbicort and wean prednisone to 10 mg daily over the next couple weeks.  Consider repeat pulmonary function testing depending on how he is doing at next visit.  Stem cell treatment for his COPD.  I tried to review the known information about this.  Explained that there is not much rigorous literature to support its use.

## 2018-03-04 NOTE — Progress Notes (Signed)
Subjective:    Patient ID: Brent Oneal, male    DOB: May 19, 1966, 52 y.o.   MRN: [id number]  HPI 52 year old smoker who has been followed by Dr. Shelle Ironlance in our office for emphysematous COPD, chronic hypoxemia, and a left upper lobe groundglass pulmonary nodules CT scan of the chest. His most recent CT was 08/03/15 the personal or reviewed. Showed some resolution of prominent left upper lobe groundglass opacity but persistence of a smaller left upper lobe and left lower lobe patchy area of groundglass. Also noted was diffuse bronchial wall thickening and emphysematous change consistent with severe COPD. Is a1 antitrypsin was 166 g/dL 4 years ago, genotype MM.  He had a URI last month, caused dyspnea and bronchitis. [facility], was treated w abx. He is on azithro TIW chronically. On symbicort and tudorza. No chronic pred. Last pred was probably by his PCP few months ago - he isn't sure.  He is smoking about 3 cig a week. He doesn;t have a plan to stop - discussed today. He uses SABA several times a week.    ROV 03/04/18 --52 year old gentleman with a history of tobacco use and severe emphysematous COPD, RB-ILD based on prior CT scans that we have followed.  I saw him last in 11/2016.  He has been on chronic prednisone 10mg  since our last visit, currently on 30mg  since an ED visit after a fall.  He is currently on Tudorza + Symbicort. He takes azithro TIW. He has been hospitalized x3 since last time I saw him. He uses albuterol about . He is on O2 at 3-4L/min. He has more exertional SOB, difficulty carrying objects. He has not smoked since our last visit. He is more limited, coughs daily. Minimal mucous.    Review of Systems Per HPI  Past Medical History:  Diagnosis Date  . Allergic   . COPD (chronic obstructive pulmonary disease) (HCC)   . Hyperlipidemia   . Hypertension      Family History  Problem Relation Age of Onset  . Emphysema Father   . Allergies Maternal  Grandmother   . Allergies Maternal Grandfather   . Asthma Father   . Heart attack Father   . Bone cancer Mother      Social History   Socioeconomic History  . Marital status: Legally Separated    Spouse name: Not on file  . Number of children: Not on file  . Years of education: Not on file  . Highest education level: Not on file  Occupational History  . Occupation: disabled.   Social Needs  . Financial resource strain: Not on file  . Food insecurity:    Worry: Not on file    Inability: Not on file  . Transportation needs:    Medical: Not on file    Non-medical: Not on file  Tobacco Use  . Smoking status: Former Smoker    Packs/day: 0.25    Years: 30.00    Pack years: 7.50    Types: Cigarettes    Last attempt to quit: 08/28/2016    Years since quitting: 1.5  . Smokeless tobacco: Never Used  . Tobacco comment: using vapor, 3 cigarettes daily  Substance and Sexual Activity  . Alcohol use: No    Alcohol/week: 0.0 oz  . Drug use: No  . Sexual activity: Not on file  Lifestyle  . Physical activity:    Days per week: Not on file    Minutes per session: Not on  file  . Stress: Not on file  Relationships  . Social connections:    Talks on phone: Not on file    Gets together: Not on file    Attends religious service: Not on file    Active member of club or organization: Not on file    Attends meetings of clubs or organizations: Not on file    Relationship status: Not on file  . Intimate partner violence:    Fear of current or ex partner: Not on file    Emotionally abused: Not on file    Physically abused: Not on file    Forced sexual activity: Not on file  Other Topics Concern  . Not on file  Social History Narrative   Lives with house mates.           Allergies  Allergen Reactions  . Amitriptyline Other (See Comments)    Blacked out Went crazy-did not know what was going on.  . Celecoxib Rash     Outpatient Medications Prior to Visit  Medication Sig  Dispense Refill  . albuterol (PROVENTIL HFA;VENTOLIN HFA) 108 (90 Base) MCG/ACT inhaler INHALE 2 PUFFS INTO THE LUNGS EVERY 6 HOURS AS NEEDED FOR BREATHING 8.5 Inhaler 0  . Ascorbic Acid (VITAMIN C) 1000 MG tablet Take 1,000 mg by mouth daily.    Marland Kitchen azithromycin (ZITHROMAX) 250 MG tablet TAKE 1 TABLET BY MOUTH ON MONDAY,WEDNESDAY, AND FRIDAY 12 tablet 2  . budesonide-formoterol (SYMBICORT) 160-4.5 MCG/ACT inhaler TAKE 2 PUFFS BY MOUTH TWICE A DAY 1 Inhaler 2  . METHADONE HCL PO Take 145 mg by mouth daily. Takes throughout the day.    . Potassium Gluconate 595 MG CAPS Take 1 capsule by mouth daily.    . predniSONE (DELTASONE) 10 MG tablet TAKE 1 OR 2 TABLTS BY MOUTH DAILY FOR MAINTENANCE 60 tablet 0  . SYMBICORT 160-4.5 MCG/ACT inhaler INHALE 2 PUFFS INTO THE LUNGS 2 (TWO) TIMES DAILY. 10.2 Inhaler 2  . TUDORZA PRESSAIR 400 MCG/ACT AEPB INHALE 1 PUFF BY MOUTH INTO THE LUNGS 2 TIMES DAILY. 1 each 0   No facility-administered medications prior to visit.       Objective:   Physical Exam Vitals:   03/04/18 0905  BP: (!) 142/80  Pulse: 92  SpO2: 90%  Weight: 155 lb 12.8 oz (70.7 kg)  Height: 5\' 8"  (1.727 m)   Gen: Pleasant, well-nourished, in no distress,  normal affect, on O2  ENT: No lesions,  mouth clear,  oropharynx clear, no postnasal drip  Neck: No JVD, no TMG, no carotid bruits  Lungs: No use of accessory muscles, quite distant with prolonged expiration, and expiratory wheeze bilaterally  Cardiovascular: RRR, heart sounds normal, no murmur or gallops, no peripheral edema  Musculoskeletal: No deformities, no cyanosis or clubbing  Neuro: alert, non focal  Skin: Warm, no lesions or rashes   CT chest 02/08/17:  1. No evidence of intraluminal filling defects involving the pulmonary arterial system to the level of the proximal segmental vessels.  2. Respiratory motion limits evaluation of the lungs including detection of pulmonary nodules. 3. Extensive centrilobular and  paraseptal emphysema. 4. Right basilar atelectasis. 5. Subtle appearance of right basilar groundglass micronodularity may be artifactual due to motion however an atypical infection cannot be excluded.     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  He  does have significant emphysematous changes as before.  We are weaning his prednisone, goal down to 10 mg daily.  I will consider repeating his imaging depending on how he is doing clinically at our next visit.  COPD (chronic obstructive pulmonary disease) with emphysema Continue Tudorza, Symbicort and wean prednisone to 10 mg daily over the next couple weeks.  Consider repeat pulmonary function testing depending on how he is doing at next visit.  Stem cell treatment for his COPD.  I tried to review the known information about this.  Explained that there is not much rigorous literature to support its use.  Chronic respiratory failure Continue submental oxygen as ordered  Levy Pupa, MD, PhD 03/04/2018, 9:52 AM [facility] [phone] or if no answer [phone]

## 2018-03-04 NOTE — Addendum Note (Signed)
Addended by: Zane HeraldMOORE, Jaanai Salemi R on: 03/04/2018 10:07 AM   Modules accepted: Orders

## 2018-03-27 ENCOUNTER — Other Ambulatory Visit: Payer: Self-pay | Admitting: Adult Health

## 2018-03-27 ENCOUNTER — Telehealth: Payer: Self-pay | Admitting: Emergency Medicine

## 2018-03-27 MED ORDER — ACLIDINIUM BROMIDE 400 MCG/ACT IN AEPB: INHALATION_SPRAY | RESPIRATORY_TRACT | 5 refills | Status: DC

## 2018-03-27 MED ORDER — BUDESONIDE-FORMOTEROL FUMARATE 160-4.5 MCG/ACT IN AERO: INHALATION_SPRAY | RESPIRATORY_TRACT | 5 refills | Status: DC

## 2018-03-27 MED ORDER — ALBUTEROL SULFATE HFA 108 (90 BASE) MCG/ACT IN AERS: 1.0000 | INHALATION_SPRAY | RESPIRATORY_TRACT | 5 refills | Status: DC | PRN

## 2018-03-27 NOTE — Telephone Encounter (Signed)
Pt requesting a refill on his inhalers- albuterol, symbicort, and tudorza.  These have been sent to preferred pharmacy.  Nothing further needed.

## 2018-03-31 ENCOUNTER — Other Ambulatory Visit: Payer: Self-pay | Admitting: Emergency Medicine

## 2018-03-31 ENCOUNTER — Telehealth: Payer: Self-pay | Admitting: Emergency Medicine

## 2018-03-31 MED ORDER — AZITHROMYCIN 250 MG PO TABS: ORAL_TABLET | ORAL | 2 refills | Status: DC

## 2018-03-31 NOTE — Telephone Encounter (Signed)
Called and spoke with patient. He is requesting refill of antibiotic. Refill sent to pharmacy. Nothing further needed.

## 2018-04-09 ENCOUNTER — Inpatient Hospital Stay: Payer: Medicaid Other | Admitting: Acute Care

## 2018-04-14 ENCOUNTER — Other Ambulatory Visit: Payer: Self-pay | Admitting: Emergency Medicine

## 2018-04-14 ENCOUNTER — Inpatient Hospital Stay: Payer: Medicaid Other | Admitting: Acute Care

## 2018-04-22 ENCOUNTER — Inpatient Hospital Stay: Payer: Medicaid Other | Admitting: Pulmonary Disease

## 2018-05-02 ENCOUNTER — Inpatient Hospital Stay: Payer: Medicaid Other | Admitting: Adult Health

## 2018-05-09 ENCOUNTER — Telehealth: Payer: Self-pay | Admitting: Emergency Medicine

## 2018-05-09 MED ORDER — ACLIDINIUM BROMIDE 400 MCG/ACT IN AEPB: INHALATION_SPRAY | RESPIRATORY_TRACT | 5 refills | Status: DC

## 2018-05-09 NOTE — Telephone Encounter (Signed)
Called and spoke to pt. Pt states that Brent Oneal is not preferred by insurance.  Rx [facility] [address] [facility]. I have verified that this is pt's preferred pharmacy. Called and spoke to [name] with [facility], who stated that [name] and that [facility] is unable to [facility] because of the hold. I have [facility] to [name], who states that they do not have [name] Rx on file.  [facility]ed [facility] and spoke with Sheria Langameron, who confirmed that Rx is on hold for [facility] as of today. I do not see record [facility]. Sheria LangCameron suggested that we resend Rx and see if it will go through without a hold.  Sheria LangCameron stated that he will call our office back with an update. Will leave message in triage until call is received

## 2018-05-12 ENCOUNTER — Other Ambulatory Visit: Payer: Self-pay | Admitting: Emergency Medicine

## 2018-05-12 NOTE — Telephone Encounter (Signed)
[facility]ed [facility], advised that PA request for Brent Oneal was sent to our office on Friday. PA request located.  PA for Brent Oneal started with [name] at [employer]. PA has been approved thru 05/07/2019.  PA #:[id number]  Called [facility] to make aware approval, [facility]. Brent LangCameron stated that  [facility] not [facility] [name] in stock. However Brent Oneal could be ordered and in stock tomorrow. Called pt and made him aware. pt stated [facility]. Called [facility] and advised them of approval, again I advised that they do not have Rx on file for pt. Pt stated that he would like to pick [facility] tomorrow. [facility] [address] and made them aware of this.  Nothing further is needed.

## 2018-07-03 ENCOUNTER — Telehealth: Payer: Self-pay | Admitting: Emergency Medicine

## 2018-07-03 MED ORDER — AZITHROMYCIN 250 MG PO TABS: ORAL_TABLET | ORAL | 2 refills | Status: DC

## 2018-07-03 NOTE — Telephone Encounter (Signed)
Spoke with pt. He is needing a refill on Azithromycin. Rx has been sent in. Nothing further was needed.

## 2018-08-13 ENCOUNTER — Telehealth: Payer: Self-pay | Admitting: Emergency Medicine

## 2018-08-13 MED ORDER — AZITHROMYCIN 250 MG PO TABS: ORAL_TABLET | ORAL | 2 refills | Status: DC

## 2018-08-13 MED ORDER — PREDNISONE 10 MG PO TABS: ORAL_TABLET | ORAL | 2 refills | Status: DC

## 2018-08-13 NOTE — Telephone Encounter (Signed)
Called and spoke with pt who is needing a refill of both prednisone and azithromycin to be called into his pharmacy. Stated to pt I would call those in for him.  Nothing further needed.

## 2018-08-29 ENCOUNTER — Telehealth: Payer: Self-pay | Admitting: Emergency Medicine

## 2018-08-29 ENCOUNTER — Other Ambulatory Visit: Payer: Self-pay | Admitting: Emergency Medicine

## 2018-08-29 MED ORDER — ALBUTEROL SULFATE HFA 108 (90 BASE) MCG/ACT IN AERS: INHALATION_SPRAY | RESPIRATORY_TRACT | 2 refills | Status: DC

## 2018-08-29 NOTE — Telephone Encounter (Signed)
Spoke with pt, advised him that I sent in the Rx for ProAir to his pharmacy. I also reminded him that it was time to follow up with Dr. Delton Coombes. I made an appt for him in November for follow up.

## 2018-10-06 ENCOUNTER — Other Ambulatory Visit: Payer: Self-pay | Admitting: Emergency Medicine

## 2018-10-13 ENCOUNTER — Other Ambulatory Visit: Payer: Self-pay | Admitting: Emergency Medicine

## 2019-01-09 ENCOUNTER — Telehealth: Payer: Self-pay | Admitting: Internal Medicine

## 2019-01-09 ENCOUNTER — Telehealth: Payer: Self-pay | Admitting: Emergency Medicine

## 2019-01-09 MED ORDER — BUDESONIDE-FORMOTEROL FUMARATE 160-4.5 MCG/ACT IN AERO: INHALATION_SPRAY | RESPIRATORY_TRACT | 12 refills | Status: DC

## 2019-01-09 NOTE — Telephone Encounter (Signed)
Needed symbicort 160 called in to [address], has upcoming appt > done

## 2019-01-09 NOTE — Telephone Encounter (Signed)
Called and spoke with Patient. [facility] stated that [facility] not [facility]. [facility], and was told [employer] [facility] is out of stock at this time, but is expected in the next few days.  Called to let Patient know.  Undersatnding stated.  Offered OV with Dr Delton Coombes, because last Ov was 03/04/2018.  OV scheduled with Dr Delton Coombes, 02/12/19, at 0930.  Nothing further at this time.

## 2019-01-30 ENCOUNTER — Other Ambulatory Visit: Payer: Self-pay | Admitting: Emergency Medicine

## 2019-02-12 ENCOUNTER — Ambulatory Visit: Payer: Medicaid Other | Admitting: Emergency Medicine

## 2019-03-10 ENCOUNTER — Other Ambulatory Visit: Payer: Self-pay | Admitting: Emergency Medicine

## 2019-03-25 ENCOUNTER — Other Ambulatory Visit: Payer: Self-pay | Admitting: Emergency Medicine

## 2019-03-25 ENCOUNTER — Telehealth: Payer: Self-pay | Admitting: Emergency Medicine

## 2019-03-25 MED ORDER — PREDNISONE 10 MG PO TABS: 10.0000 mg | ORAL_TABLET | Freq: Every day | ORAL | 3 refills | Status: DC

## 2019-03-25 MED ORDER — ALBUTEROL SULFATE HFA 108 (90 BASE) MCG/ACT IN AERS: INHALATION_SPRAY | RESPIRATORY_TRACT | 2 refills | Status: DC

## 2019-03-25 MED ORDER — BUDESONIDE-FORMOTEROL FUMARATE 160-4.5 MCG/ACT IN AERO: INHALATION_SPRAY | RESPIRATORY_TRACT | 12 refills | Status: DC

## 2019-03-25 MED ORDER — AZITHROMYCIN 250 MG PO TABS: ORAL_TABLET | ORAL | 2 refills | Status: DC

## 2019-03-25 NOTE — Telephone Encounter (Signed)
meds have been sent to pt's preferred pharmacy. Called and spoke with pt letting him know this had been done. Pt expressed understanding. Nothing further needed.

## 2019-06-01 ENCOUNTER — Telehealth: Payer: Self-pay | Admitting: Emergency Medicine

## 2019-06-01 NOTE — Telephone Encounter (Signed)
Pt called back a/b medication and I let him know that we are waiting on pharm to send PA that is needed for Dr to sing-off on for his med pt understood and was satisfied.Hillery Hunter

## 2019-06-01 NOTE — Telephone Encounter (Signed)
Called and spoke with pt. Pt stated he had been trying to get his Caprice Renshaw refilled but was unable to do so and he stated he believed it was due to his medicaid. I stated to pt that I would call pharmacy to see if a PA was needed and pt verbalized understanding. Called pt's pharmacy to see if pt's med needed a PA and they stated that it did that they had sent the PA request to our office.  After verifying the fax number they had on file, stated to them that that was our old office fax as we have moved. I provided them with our new fax number so they can refax the PA request to our office to be completed. Will complete PA once it has been received.  Will keep encounter open to look out for PA request on pt's med.

## 2019-06-02 NOTE — Telephone Encounter (Signed)
Patient has Medicaid so I called [employer] to get the PA done. PA has been approved until July 1st. Patient is aware.   [facility] is aware of approval.   Nothing further needed at time of call.

## 2019-06-02 NOTE — Telephone Encounter (Signed)
Patient is checking the status of the [employer].

## 2019-06-12 ENCOUNTER — Other Ambulatory Visit: Payer: Self-pay | Admitting: Emergency Medicine

## 2019-06-21 ENCOUNTER — Other Ambulatory Visit: Payer: Self-pay | Admitting: Emergency Medicine

## 2019-06-24 ENCOUNTER — Telehealth: Payer: Self-pay | Admitting: Emergency Medicine

## 2019-06-24 NOTE — Telephone Encounter (Signed)
Pt has cancelled multiple hospital follow up appointments and also has no showed for an appt that was scheduled. Pt will need have OV before meds will be able to be refilled. The appts that were scheduled 04/2018 has a note stated that pt will need to have a qualifying walk performed.  Called pt's pharmacy and spoke with Pamala Hurry, pharmacist letting her know that pt will need appt prior to receiving med refills due to multiple appt cancellations or no shows. Pamala Hurry verbalized understanding.  Attempted to call pt to get him scheduled for an appt but unable to reach pt. Left message for pt to return call.

## 2019-06-25 NOTE — Telephone Encounter (Signed)
I spoke with pt and he is coming in to see Derl Barrow on Wednesday 07/01/2019 at 9:30. Pt asked me to call his transportation service for the appt. I called [phone] to schedule a transportation appt. They will pick up patient and bring him to the appt. Nothing further is needed.

## 2019-07-01 ENCOUNTER — Encounter: Payer: Self-pay | Admitting: Primary Care

## 2019-07-01 ENCOUNTER — Other Ambulatory Visit: Payer: Self-pay

## 2019-07-01 ENCOUNTER — Ambulatory Visit (INDEPENDENT_AMBULATORY_CARE_PROVIDER_SITE_OTHER): Payer: Medicaid Other | Admitting: Primary Care

## 2019-07-01 VITALS — BP 124/80 | HR 96 | Temp 98.1°F | Ht 68.0 in | Wt 135.6 lb

## 2019-07-01 DIAGNOSIS — K5900 Constipation, unspecified: Secondary | ICD-10-CM | POA: Insufficient documentation

## 2019-07-01 DIAGNOSIS — J84115 Respiratory bronchiolitis interstitial lung disease: Secondary | ICD-10-CM

## 2019-07-01 DIAGNOSIS — J849 Interstitial pulmonary disease, unspecified: Secondary | ICD-10-CM | POA: Diagnosis not present

## 2019-07-01 DIAGNOSIS — Z5181 Encounter for therapeutic drug level monitoring: Secondary | ICD-10-CM

## 2019-07-01 MED ORDER — AZITHROMYCIN 250 MG PO TABS: ORAL_TABLET | ORAL | 3 refills | Status: AC

## 2019-07-01 MED ORDER — TUDORZA PRESSAIR 400 MCG/ACT IN AEPB: 1.0000 | INHALATION_SPRAY | Freq: Two times a day (BID) | RESPIRATORY_TRACT | 0 refills | Status: AC

## 2019-07-01 MED ORDER — BUDESONIDE-FORMOTEROL FUMARATE 160-4.5 MCG/ACT IN AERO: 2.0000 | INHALATION_SPRAY | Freq: Two times a day (BID) | RESPIRATORY_TRACT | 0 refills | Status: AC

## 2019-07-01 MED ORDER — BUDESONIDE-FORMOTEROL FUMARATE 160-4.5 MCG/ACT IN AERO: INHALATION_SPRAY | RESPIRATORY_TRACT | 5 refills | Status: DC

## 2019-07-01 MED ORDER — ALBUTEROL SULFATE HFA 108 (90 BASE) MCG/ACT IN AERS: INHALATION_SPRAY | RESPIRATORY_TRACT | 3 refills | Status: DC

## 2019-07-01 MED ORDER — TUDORZA PRESSAIR 400 MCG/ACT IN AEPB: INHALATION_SPRAY | RESPIRATORY_TRACT | 5 refills | Status: DC

## 2019-07-01 NOTE — Progress Notes (Signed)
$'@Patient't$  ID: Brent Oneal, male    DOB: 06/29/1966, 53 y.o.   MRN: [id number]  Chief Complaint  Patient presents with  . Hospitalization Follow-up    Patient reports that he still has sob with exertion, productive cough with clear sputum.     Referring provider: No ref. provider found  HPI: 53 year old male, former smoker. PMH significant for COPD, chronic respiratory failure, RB-ILD, pulmonary nodule, substance abuse, rheumatoid arthritis. Patient of Dr. Lamonte Sakai, last seen on 03/04/18. CT 2018 at [facility] some ILD improved, significant emphysematous changes. Plan weaning prednisone to goal '10mg'$ . May need repeat imaging and PFTs depending on how patient is at next follow-up.   07/01/2019 Patient presents today for follow-up. He has not been seen in over 1 year, he has had multiple ED visits for shortness of breath. Maintained on Tudorza and Symbicort. Frequently runs out of his medication and takes more Symbicort than prescribed. Continues azithromycin MWF. Currently taking '30mg'$  prednisone. Breathing is the same but bad. He will get short of breath on using the bathroom and can not strain. States that he is ok when he walks. Coughs all the time, get up clear/white mucus. Using 4-5L oxygen at home. Denies wheezing. Still smoking when he gets upset/aggrevated    Allergies  Allergen Reactions  . Amitriptyline Other (See Comments)    Blacked out Went crazy-did not know what was going on.  . Celecoxib Rash    Immunization History  Administered Date(s) Administered  . Influenza Split 10/02/2011, 10/26/2013  . Influenza, Seasonal, Injecte, Preservative Fre 12/22/2014  . Influenza,inj,Quad PF,6+ Mos 08/28/2016  . Pneumococcal Conjugate-13 12/22/2014  . Td 05/10/2005    Past Medical History:  Diagnosis Date  . Allergic   . COPD (chronic obstructive pulmonary disease) ([place])   . Hyperlipidemia   . Hypertension     Tobacco History: Social History   Tobacco Use  Smoking Status  Former Smoker  . Packs/day: 0.25  . Years: 30.00  . Pack years: 7.50  . Types: Cigarettes  . Quit date: 08/28/2016  . Years since quitting: 2.8  Smokeless Tobacco Never Used   Counseling given: Not Answered   Outpatient Medications Prior to Visit  Medication Sig Dispense Refill  . albuterol (PROAIR HFA) 108 (90 Base) MCG/ACT inhaler INHALE 1-2 PUFFS INTO THE LUNGS EVERY 4 (FOUR) HOURS AS NEEDED FOR WHEEZING OR SHORTNESS OF BREATH. 8.5 Inhaler 2  . Ascorbic Acid (VITAMIN C) 1000 MG tablet Take 1,000 mg by mouth daily.    . budesonide-formoterol (SYMBICORT) 160-4.5 MCG/ACT inhaler Take 2 puffs first thing in am and then another 2 puffs about 12 hours later. 1 Inhaler 12  . METHADONE HCL PO Take 145 mg by mouth daily. Takes throughout the day.    . Potassium Gluconate 595 MG CAPS Take 1 capsule by mouth daily.    . predniSONE (DELTASONE) 10 MG tablet Take 1 tablet (10 mg total) by mouth daily with breakfast. TAKE 1 OR 2 TABLTS BY MOUTH DAILY FOR MAINTENANCE 60 tablet 3  . TUDORZA PRESSAIR 400 MCG/ACT AEPB INHALE 1 PUFF BY MOUTH INTO THE LUNGS 2 TIMES DAILY. 1 each 5  . azithromycin (ZITHROMAX) 250 MG tablet Take one tablet Monday, Wednesday, friday 33 tablet 2   No facility-administered medications prior to visit.     Review of Systems  Review of Systems  Constitutional: Negative.   Respiratory: Positive for cough and shortness of breath. Negative for wheezing.   Cardiovascular: Negative.    Physical Exam  BP 124/80 (BP Location: Left Arm, Cuff Size: Normal)   Pulse 96   Temp 98.1 F (36.7 C) (Oral)   Ht _0  (1.727 m)   Wt 135 lb 9.6 oz (61.5 kg)   SpO2 96%   BMI 20.62 kg/m  Physical Exam Constitutional:      Appearance: Normal appearance.  HENT:     Head: Normocephalic and atraumatic.  Cardiovascular:     Rate and Rhythm: Tachycardia present.  Pulmonary:     Effort: Pulmonary effort is normal.     Breath sounds: Rales present. No wheezing.     Comments: Crackles  bilateral bases; 96% 4L oxygen  Neurological:     General: No focal deficit present.     Mental Status: He is alert and oriented to person, place, and time. Mental status is at baseline.  Psychiatric:        Mood and Affect: Mood normal.        Behavior: Behavior normal.        Thought Content: Thought content normal.        Judgment: Judgment normal.      Lab Results:  CBC    Component Value Date/Time   WBC 13.5 (H) 02/04/2017 1137   RBC 4.65 02/04/2017 1137   HGB 12.8 (L) 02/04/2017 1137   HCT 39.7 02/04/2017 1137   PLT 253.0 02/04/2017 1137   MCV 85.3 02/04/2017 1137   MCH 26.8 09/28/2010 0515   MCHC 32.2 02/04/2017 1137   RDW 14.8 02/04/2017 1137   LYMPHSABS 1.6 02/04/2017 1137   MONOABS 0.9 02/04/2017 1137   EOSABS 0.1 02/04/2017 1137   BASOSABS 0.1 02/04/2017 1137    BMET    Component Value Date/Time   NA 142 02/04/2017 1137   K 4.4 02/04/2017 1137   CL 96 02/04/2017 1137   CO2 40 (H) 02/04/2017 1137   GLUCOSE 112 (H) 02/04/2017 1137   BUN 19 02/04/2017 1137   CREATININE 0.78 02/04/2017 1137   CALCIUM 9.3 02/04/2017 1137   GFRNONAA >60 09/26/2010 0610   GFRAA  09/26/2010 0610    >60        The eGFR has been calculated using the MDRD equation. This calculation has not been validated in all clinical situations. eGFR's persistently <60 mL/min signify possible Chronic Kidney Disease.    BNP No results found for: BNP  ProBNP    Component Value Date/Time   PROBNP 17.0 02/04/2017 1137    Imaging: No results found.   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 ByLamonte Sakaineeds to keep scheduled visits to receive refills)    Chronic respiratory failure - Continue oxygen 4L, titrate to keep O2 sat >88%  Constipation - Recommend Colace daily    ElMartyn EhrichNP 07/01/2019

## 2019-07-01 NOTE — Assessment & Plan Note (Addendum)
-   Baseline dyspnea with activity, compliance with office follow-up is an issue - Continue Symbicort 160 2 puffs twice daily (educated patient not to take more than directed) - Continue Tudorza 1 puffs twice daily - Continue Azithromycin MWF  - Needs repeat PFTs - He has been to pulmonary rehab before but quit- offered again during today's visit but declined - FU in 3 months with Dr. Lamonte Sakai (needs to keep scheduled visits to receive refills)

## 2019-07-01 NOTE — Addendum Note (Signed)
Addended by: Hildred Alamin I on: 07/01/2019 10:40 AM   Modules accepted: Orders

## 2019-07-01 NOTE — Patient Instructions (Addendum)
Try decreasing prednisone to 20mg  daily  Recommendations: Azithromycin ( Monday, Wednesday, Friday ) Prednisone 20mg  daily (goal is 10mg  daily) Tudorza - 1 puffs twice daily  Symbicort - 2 puffs twice daily  Albuterol hfa 2 puffs every 4 hours as needed for shortness of breath/wheezing   Orders: EKG today HRCT in 3 months  PFTs in 3 months  Follow-up Dr. Lamonte Sakai in 3 months

## 2019-07-01 NOTE — Addendum Note (Signed)
Addended by: Martyn Ehrich on: 07/01/2019 11:18 AM   Modules accepted: Orders

## 2019-07-01 NOTE — Assessment & Plan Note (Addendum)
-   Still occasionally smoking  - Encouraged patient to decrease prednisone to 20mg  daily (goal 10mg ) - Needs repeat HRCT

## 2019-07-01 NOTE — Assessment & Plan Note (Signed)
-   Recommend Colace daily

## 2019-07-01 NOTE — Assessment & Plan Note (Signed)
-   Continue oxygen 4L, titrate to keep O2 sat >88%

## 2019-07-03 ENCOUNTER — Telehealth: Payer: Self-pay | Admitting: Emergency Medicine

## 2019-07-03 NOTE — Telephone Encounter (Signed)
PA for Tudorza Pressair 400 MCG/ACT initiated with [employer] form.   Medication Name and Strength: Brent Oneal Pressair 400 MCG/ACT Adult nurse: [employer] Pharmacy: [facility] Patient's Insurance ID: [id number] M Phone: [phone] [place]: Charlann Lange Marc[place]: [phone] Ticket #: [id number]

## 2019-08-12 ENCOUNTER — Other Ambulatory Visit: Payer: Self-pay | Admitting: Emergency Medicine

## 2019-09-24 ENCOUNTER — Telehealth: Payer: Self-pay

## 2019-09-24 DIAGNOSIS — J849 Interstitial pulmonary disease, unspecified: Secondary | ICD-10-CM

## 2019-09-24 NOTE — Telephone Encounter (Signed)
-----   Message from Osvaldo Shipper, [place] sent at 09/24/2019  9:49 AM EDT ----- Regarding: CT need New order and to be Resch Please put in new order and reschedule patient at another facility. We are unable to do CT because of insurance. Pt has appt 10/27  Pt is Not AWARE appt was canceled I called patient and left a message not to come for appt and gave my my number.   Thank you   Erline Levine

## 2019-09-24 NOTE — Telephone Encounter (Signed)
New order placed. Patient has not been made aware. Message has been left with patient.

## 2019-09-25 ENCOUNTER — Telehealth: Payer: Self-pay | Admitting: *Deleted

## 2019-09-25 NOTE — Telephone Encounter (Signed)
Called patient left message to call back to confirm he received message about having to cancel CT appt.

## 2019-09-29 ENCOUNTER — Inpatient Hospital Stay: Admission: RE | Admit: 2019-09-29 | Payer: Medicaid Other | Source: Ambulatory Visit

## 2019-10-02 ENCOUNTER — Ambulatory Visit: Payer: Medicaid Other | Admitting: Emergency Medicine

## 2019-10-02 NOTE — Telephone Encounter (Signed)
Called [employer] [phone] and spoke with rep Patsy. Patsy verified that Brent Oneal has been approved from July 31st 2020 - July 26th 2021. Approval #: [id number]. Pharmacy has been made aware of this and will make the patient aware. Nothing further needed at this time.

## 2019-11-09 ENCOUNTER — Other Ambulatory Visit: Payer: Self-pay | Admitting: Primary Care

## 2019-12-28 ENCOUNTER — Other Ambulatory Visit: Payer: Self-pay | Admitting: Primary Care

## 2020-01-12 ENCOUNTER — Ambulatory Visit: Payer: Medicaid Other | Admitting: Emergency Medicine

## 2020-01-21 ENCOUNTER — Ambulatory Visit (INDEPENDENT_AMBULATORY_CARE_PROVIDER_SITE_OTHER): Payer: Medicaid Other | Admitting: Emergency Medicine

## 2020-01-21 DIAGNOSIS — J439 Emphysema, unspecified: Secondary | ICD-10-CM

## 2020-01-21 NOTE — Progress Notes (Signed)
Virtual Visit via Telephone Note   Location: Patient: Home Provider: Home Office    History of Present Illness: Brent Oneal is 10, former smoker with a history of significant COPD and associated chronic hypoxemic respiratory failure, RB-ILD, rheumatoid arthritis.  I last saw him 03/04/2018, he has been seen by B Clent Ridges here in the interim.    Observations/Objective: Has been maintained on Tudorza, Symbicort, azithromycin MWF, Chronic prednisone at a dose of 10 mg, oxygen at 4-5 L/min His last CT scan of the chest was done 08/02/2016 which I have reviewed   I was unable to get the patient on the phone. Will void this visit and work on setting up another visit in the future.    Assessment and Plan:   Follow Up Instructions:    I discussed the assessment and treatment plan with the patient. The patient was provided an opportunity to ask questions and all were answered. The patient agreed with the plan and demonstrated an understanding of the instructions.   The patient was advised to call back or seek an in-person evaluation if the symptoms worsen or if the condition fails to improve as anticipated.  I provided 0 minutes of non-face-to-face time during this encounter. Unable to reach him, called several times.    Leslye Peer, MD ;

## 2020-02-28 ENCOUNTER — Other Ambulatory Visit: Payer: Self-pay | Admitting: Primary Care

## 2020-02-29 ENCOUNTER — Telehealth: Payer: Self-pay | Admitting: Emergency Medicine

## 2020-02-29 MED ORDER — BUDESONIDE-FORMOTEROL FUMARATE 160-4.5 MCG/ACT IN AERO: INHALATION_SPRAY | RESPIRATORY_TRACT | 5 refills | Status: AC

## 2020-02-29 NOTE — Telephone Encounter (Signed)
Pt calling back regarding medications to be filled.  Pharmacy states we will not fill them.  Sent someone out to pick them up and needs ASAP.   Please advise. [phone]

## 2020-02-29 NOTE — Telephone Encounter (Signed)
Looking in chart patient has refills on the Tudorza remaining. I called and spoke with the pharmacy and they confirmed that he does have those on file and they are getting that ready, but none on the Symbicort. I called and advised the patient that they were filling the Tudorza and that I am sending over a refill on the Symbicort. He verbalized understanding.

## 2020-10-03 DEATH — deceased
